# Patient Record
Sex: Female | Born: 1980 | Race: White | Marital: Married
Health system: Northeastern US, Academic
[De-identification: ages and names within clinical notes are randomized; demographics above are authoritative.]

## PROBLEM LIST (undated history)

## (undated) ENCOUNTER — Encounter

## (undated) HISTORY — PX: BREAST LUMPECTOMY: SHX2

---
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
## 2006-09-06 ENCOUNTER — Ambulatory Visit: Payer: Self-pay | Admitting: Internal Medicine

## 2006-10-21 ENCOUNTER — Ambulatory Visit: Payer: Self-pay | Admitting: Family Medicine

## 2006-11-09 ENCOUNTER — Ambulatory Visit: Payer: Self-pay | Admitting: Psychology

## 2006-11-09 DIAGNOSIS — F4323 Adjustment disorder with mixed anxiety and depressed mood: Secondary | ICD-10-CM | POA: Insufficient documentation

## 2006-11-23 ENCOUNTER — Ambulatory Visit: Payer: Self-pay | Admitting: Family Medicine

## 2006-12-07 ENCOUNTER — Ambulatory Visit: Payer: Self-pay | Admitting: Family Medicine

## 2006-12-21 ENCOUNTER — Ambulatory Visit: Payer: Self-pay | Admitting: Family Medicine

## 2007-01-12 ENCOUNTER — Ambulatory Visit: Payer: Self-pay | Admitting: Family Medicine

## 2007-04-26 ENCOUNTER — Encounter (INDEPENDENT_AMBULATORY_CARE_PROVIDER_SITE_OTHER): Payer: Self-pay | Admitting: *Deleted

## 2007-04-26 ENCOUNTER — Ambulatory Visit: Payer: Self-pay | Admitting: Internal Medicine

## 2007-04-26 DIAGNOSIS — H209 Unspecified iridocyclitis: Secondary | ICD-10-CM | POA: Insufficient documentation

## 2007-05-11 ENCOUNTER — Ambulatory Visit: Payer: Self-pay | Admitting: Internal Medicine

## 2007-05-12 ENCOUNTER — Encounter (INDEPENDENT_AMBULATORY_CARE_PROVIDER_SITE_OTHER): Payer: Self-pay | Admitting: *Deleted

## 2015-03-21 ENCOUNTER — Encounter: Payer: Self-pay | Admitting: Emergency Medicine

## 2015-03-21 ENCOUNTER — Ambulatory Visit
Admission: EM | Admit: 2015-03-21 | Discharge: 2015-03-21 | Disposition: A | Discharge status: Home / Self Care | Payer: Medicaid - Out of State | Attending: Internal Medicine | Admitting: Internal Medicine

## 2015-03-21 DIAGNOSIS — L720 Epidermal cyst: Secondary | ICD-10-CM

## 2015-03-21 DIAGNOSIS — L739 Follicular disorder, unspecified: Secondary | ICD-10-CM

## 2015-03-21 MED ORDER — SULFAMETHOXAZOLE-TRIMETHOPRIM 800-160 MG PO TABS: 1.0000 | ORAL_TABLET | Freq: Two times a day (BID) | ORAL | Status: DC

## 2015-03-21 MED ORDER — IBUPROFEN 800 MG PO TABS: 800.0000 mg | ORAL_TABLET | Freq: Three times a day (TID) | ORAL | Status: AC

## 2015-03-21 NOTE — Discharge Instructions (Signed)
Cyst Removal Your caregiver has recommended removed removal of a cyst after acute infection has resolved. A cyst is a sac containing a semi-solid material. Cysts may occur any place on your body. They may remain small for years or gradually get larger. A sebaceous cyst is an enlarged (dilated) sweat gland filled with old sweat (sebum). Unattended, these may become large (the size of a softball) over several years time. These are often removed for improved appearance (cosmetic) reasons or before they become infected to form an abscess. An abscess is an infected cyst. HOME CARE INSTRUCTIONS   Apply warm compresses.  If possible, keep the area where the cyst is located raised to relieve soreness, swelling, and promote healing.  Take medicines as instructed by your caregiver. SEEK IMMEDIATE MEDICAL CARE IF:   An oral temperature above 102 F (38.9 C) develops, not controlled by medication.  You have increasing pain in the area where your cyst was located.  You have redness, swelling, pus, a bad smell, soreness (inflammation), or red streaks coming away from the cyst. These are signs of infection. MAKE SURE YOU:   Understand these instructions.  Will watch your condition.  Will get help right away if you are not doing well or get worse. Document Released: 08/14/2000 Document Revised: 11/09/2011 Document Reviewed: 12/08/2007 [facility] Patient Information 2015 [place], [place]. This information is not intended to replace advice given to you by your health care provider. Make sure you discuss any questions you have with your health care provider.  Excision of Skin Lesions Excision of a skin lesion refers to the removal of a section of skin by making small cuts (incisions) in the skin. This is typically done to remove a cancerous growth (basal cell carcinoma, squamous cell carcinoma, or melanoma) or a noncancerous growth (cyst). It may be done to treat or prevent cancer or infection. It may also be  done to improve cosmetic appearance (removal of mole, skin tag). LET YOUR CAREGIVER KNOW ABOUT:   Allergies to food or medicine.  Medicines taken, including vitamins, herbs, eyedrops, over-the-counter medicines, and creams.  Use of steroids (by mouth or creams).  Previous problems with anesthetics or numbing medicines.  History of bleeding problems or blood clots.  History of any prostheses.  Previous surgery.  Other health problems, including diabetes and kidney problems.  Possibility of pregnancy, if this applies. RISKS AND COMPLICATIONS  Many complications can be managed. With appropriate treatment and rehabilitation, the following complications are very uncommon:  Bleeding.  Infection.  Scarring.  Recurrence of cyst or cancer.  Changes in skin sensation or appearance (discoloration, swelling).  Reaction to anesthesia.  Allergic reaction to surgical materials or ointments.  Damage to nerves, blood vessels, muscles, or other structures.  Continued pain. BEFORE THE PROCEDURE  It is important to follow your caregiver's instructions prior to your procedure to avoid complications. Steps before your procedure may include:  Physical exam, blood tests, other procedures, such as removing a small sample for examination under a microscope (biopsy).  Your caregiver may review the procedure, the anesthesia being used, and what to expect after the procedure with you. You may be asked to:  Stop taking certain medicines, such as blood thinners (including aspirin, clopidogrel, ibuprofen), for several days prior to your procedure.  Take certain medicines.  Stop smoking. It is a good idea to arrange for a ride home after surgery and to have someone to help you with activities during recovery. PROCEDURE  There are several excision techniques. The type  of excision or surgical technique used will depend on your condition, the location of the lesion, and your overall health. After  the lesion is sterilized and a local anesthetic is applied, the following may be performed: Complete surgical excision The area to be removed is marked with a pen. Using a small scalpel and scissors, the surgeon gently cuts around and under the lesion until it is completely removed. The lesion is placed in a special fluid and sent to the lab for examination. If necessary, bleeding will be controlled with a device that delivers heat. The edges of the wound are stitched together and a dressing is applied. This procedure may be performed to treat a cancerous growth or noncancerous cyst or lesion. Surgeons commonly perform an elliptical excision, to minimize scarring. Excision of a cyst The surgeon makes an incision on the cyst. The entire cyst is removed through the incision. The wound may be closed with a suture (stitch). Shave excision During shave excision, the surgeon uses a small blade or loop instrument to shave off the lesion. This may be done to remove a mole or skin tag. The wound is usually left to heal on its own without stitches. Punch excision During punch excision, the surgeon uses a small, round tool (like a cookie cutter) to cut a circle shape out of the skin. The outer edges of the skin are stitched together. This may be done to remove a mole or scar or to perform a biopsy of the lesion. Mohs micrographic surgery During Mohs micrographic surgery, layers of the lesion are removed with a scalpel or loop instrument and immediately examined under a microscope until all of the abnormal or cancerous tissue is removed. This procedure is minimally invasive and ensures the best cosmetic outcome, with removal of as little normal tissue as possible. Mohs is usually done to treat skin cancer, such as basal cell carcinoma or squamous cell carcinoma, particularly on the face and ears. Antibiotic ointment is applied to the surgical area after each of the procedures listed above, as necessary. AFTER THE  PROCEDURE  How well you heal depends on many factors. Most patients heal quite well with proper techniques and self-care. Scarring will lessen over time. HOME CARE INSTRUCTIONS   Take medicines for pain as directed.  Keep the incision area clean, dry, and protected for at least 48 hours. Change dressings as directed.  For bleeding, apply gentle but firm pressure to the wound using a folded towel for 20 minutes. Call your caregiver if bleeding does not stop.  Avoid high-impact exercise and activities until the stitches are removed or the area heals.  Follow your caregiver's instructions to minimize scarring. Avoid sun exposure until the area has healed. Scarring should lessen over time.  Follow up with your caregiver as directed. Removal of stitches within 4 to 14 days may be necessary. Finding out the results of your test Not all test results are available during your visit. If your test results are not back during the visit, make an appointment with your caregiver to find out the results. Do not assume everything is normal if you have not heard from your caregiver or the medical facility. It is important for you to follow up on all of your test results. SEEK MEDICAL CARE IF:   You or your child has an oral temperature above 102 F (38.9 C).  You develop signs of infection (chills, feeling unwell).  You notice bleeding, pain, discharge, redness, or swelling at the incision site.  You notice skin irregularities or changes in sensation. MAKE SURE YOU:   Understand these instructions.  Will watch your condition.  Will get help right away if you are not doing well or get worse. FOR MORE INFORMATION  American Academy [employer]: www.https://powers.com/ American Academy of Dermatology: InfoExam.si Document Released: 11/11/2009 Document Revised: 11/09/2011 Document Reviewed: 11/11/2009 [facility] Patient Information 2015 [place], [place]. This information is not intended to replace advice  given to you by your health care provider. Make sure you discuss any questions you have with your health care provider. Abscess An abscess is an infected area that contains a collection of pus and debris.It can occur in almost any part of the body. An abscess is also known as a furuncle or boil. CAUSES  An abscess occurs when tissue gets infected. This can occur from blockage of oil or sweat glands, infection of hair follicles, or a minor injury to the skin. As the body tries to fight the infection, pus collects in the area and creates pressure under the skin. This pressure causes pain. People with weakened immune systems have difficulty fighting infections and get certain abscesses more often.  SYMPTOMS Usually an abscess develops on the skin and becomes a painful mass that is red, warm, and tender. If the abscess forms under the skin, you may feel a moveable soft area under the skin. Some abscesses break open (rupture) on their own, but most will continue to get worse without care. The infection can spread deeper into the body and eventually into the bloodstream, causing you to feel ill.  DIAGNOSIS  Your caregiver will take your medical history and perform a physical exam. A sample of fluid may also be taken from the abscess to determine what is causing your infection. TREATMENT  Your caregiver may prescribe antibiotic medicines to fight the infection. However, taking antibiotics alone usually does not cure an abscess. Your caregiver may need to make a small cut (incision) in the abscess to drain the pus. In some cases, gauze is packed into the abscess to reduce pain and to continue draining the area. HOME CARE INSTRUCTIONS   Only take over-the-counter or prescription medicines for pain, discomfort, or fever as directed by your caregiver.  If you were prescribed antibiotics, take them as directed. Finish them even if you start to feel better.  If gauze is used, follow your caregiver's directions  for changing the gauze.  To avoid spreading the infection:  Keep your draining abscess covered with a bandage.  Wash your hands well.  Do not share personal care items, towels, or whirlpools with others.  Avoid skin contact with others.  Keep your skin and clothes clean around the abscess.  Keep all follow-up appointments as directed by your caregiver. SEEK MEDICAL CARE IF:   You have increased pain, swelling, redness, fluid drainage, or bleeding.  You have muscle aches, chills, or a general ill feeling.  You have a fever. MAKE SURE YOU:   Understand these instructions.  Will watch your condition.  Will get help right away if you are not doing well or get worse. Document Released: 05/27/2005 Document Revised: 02/16/2012 Document Reviewed: 10/30/2011 [facility] Patient Information 2015 [place], [place]. This information is not intended to replace advice given to you by your health care provider. Make sure you discuss any questions you have with your health care provider. Cellulitis Cellulitis is an infection of the skin and the tissue beneath it. The infected area is usually red and tender. Cellulitis occurs most  often in the arms and lower legs.  CAUSES  Cellulitis is caused by bacteria that enter the skin through cracks or cuts in the skin. The most common types of bacteria that cause cellulitis are staphylococci and streptococci. SIGNS AND SYMPTOMS   Redness and warmth.  Swelling.  Tenderness or pain.  Fever. DIAGNOSIS  Your health care provider can usually determine what is wrong based on a physical exam. Blood tests may also be done. TREATMENT  Treatment usually involves taking an antibiotic medicine. HOME CARE INSTRUCTIONS   Take your antibiotic medicine as directed by your health care provider. Finish the antibiotic even if you start to feel better.  Keep the infected arm or leg elevated to reduce swelling.  Apply a warm cloth to the affected area up to 4  times per day to relieve pain.  Take medicines only as directed by your health care provider.  Keep all follow-up visits as directed by your health care provider. SEEK MEDICAL CARE IF:   You notice red streaks coming from the infected area.  Your red area gets larger or turns dark in color.  Your bone or joint underneath the infected area becomes painful after the skin has healed.  Your infection returns in the same area or another area.  You notice a swollen bump in the infected area.  You develop new symptoms.  You have a fever. SEEK IMMEDIATE MEDICAL CARE IF:   You feel very sleepy.  You develop vomiting or diarrhea.  You have a general ill feeling (malaise) with muscle aches and pains. MAKE SURE YOU:   Understand these instructions.  Will watch your condition.  Will get help right away if you are not doing well or get worse. Document Released: 05/27/2005 Document Revised: 01/01/2014 Document Reviewed: 11/02/2011 [facility] Patient Information 2015 [place], [place]. This information is not intended to replace advice given to you by your health care provider. Make sure you discuss any questions you have with your health care provider. Folliculitis  Folliculitis is redness, soreness, and swelling (inflammation) of the hair follicles. This condition can occur anywhere on the body. People with weakened immune systems, diabetes, or obesity have a greater risk of getting folliculitis. CAUSES  Bacterial infection. This is the most common cause.  Fungal infection.  Viral infection.  Contact with certain chemicals, especially oils and tars. Long-term folliculitis can result from bacteria that live in the nostrils. The bacteria may trigger multiple outbreaks of folliculitis over time. SYMPTOMS Folliculitis most commonly occurs on the scalp, thighs, legs, back, buttocks, and areas where hair is shaved frequently. An early sign of folliculitis is a small, white or yellow,  pus-filled, itchy lesion (pustule). These lesions appear on a red, inflamed follicle. They are usually less than 0.2 inches (5 mm) wide. When there is an infection of the follicle that goes deeper, it becomes a boil or furuncle. A group of closely packed boils creates a larger lesion (carbuncle). Carbuncles tend to occur in hairy, sweaty areas of the body. DIAGNOSIS  Your caregiver can usually tell what is wrong by doing a physical exam. A sample may be taken from one of the lesions and tested in a lab. This can help determine what is causing your folliculitis. TREATMENT  Treatment may include:  Applying warm compresses to the affected areas.  Taking antibiotic medicines orally or applying them to the skin.  Draining the lesions if they contain a large amount of pus or fluid.  Laser hair removal for cases of long-lasting  folliculitis. This helps to prevent regrowth of the hair. HOME CARE INSTRUCTIONS  Apply warm compresses to the affected areas as directed by your caregiver.  If antibiotics are prescribed, take them as directed. Finish them even if you start to feel better.  You may take over-the-counter medicines to relieve itching.  Do not shave irritated skin.  Follow up with your caregiver as directed. SEEK IMMEDIATE MEDICAL CARE IF:   You have increasing redness, swelling, or pain in the affected area.  You have a fever. MAKE SURE YOU:  Understand these instructions.  Will watch your condition.  Will get help right away if you are not doing well or get worse. Document Released: 10/26/2001 Document Revised: 02/16/2012 Document Reviewed: 11/17/2011 [facility] Patient Information 2015 [place], [place]. This information is not intended to replace advice given to you by your health care provider. Make sure you discuss any questions you have with your health care provider.

## 2015-03-21 NOTE — ED Provider Notes (Signed)
CSN: [id number]     Arrival date & time 03/21/15  1511 History   First MD Initiated Contact with Patient 03/21/15 1546     Chief Complaint  Patient presents with  . Abscess   (Consider location/radiation/quality/duration/timing/severity/associated sxs/prior Treatment) HPI Comments: New patient Married caucasian female visiting area family from [place].  Pain left upper back, swelling, redness over recurrent cyst that had popped 5 years ago and resolved but then started to enlarge again 2 years ago and is now larger than when it popped 5 years ago.  Denied insect bite, trauma to area but noted redness, swelling, tenderness and pain worsening over the past 2 days.  Wondering if it is an abscess that needs to be drained or the cyst is infected.  Also has lesions on bikini line she would like evaluated present for a couple of months, calls it "blood blister" using razor and shaving genital hair weekly.  Patient is a 34 y.o. female presenting with abscess. The history is provided by the patient.  Abscess Location:  Torso Torso abscess location:  Upper back Size:  2cm Abscess quality: induration, painful, redness and warmth   Abscess quality: not draining, no fluctuance, no itching and not weeping   Red streaking: no   Duration:  2 days Progression:  Worsening Pain details:    Quality:  Pressure and throbbing   Severity:  Moderate   Duration:  2 days   Timing:  Constant   Progression:  Worsening Chronicity:  Recurrent Context: not diabetes, not immunosuppression, not injected drug use, not insect bite/sting and not skin injury   Relieved by:  Nothing Associated symptoms: no anorexia, no fatigue, no fever, no headaches, no nausea and no vomiting   Risk factors: prior abscess   Risk factors: no family hx of MRSA and no hx of MRSA     History reviewed. No pertinent past medical history. Past Surgical History  Procedure Laterality Date  . Breast lumpectomy     History reviewed. No pertinent  family history. History  Substance Use Topics  . Smoking status: Never Smoker   . Smokeless tobacco: Never Used  . Alcohol Use: No   OB History    No data available     Review of Systems  Constitutional: Negative for fever, chills, diaphoresis, activity change, appetite change and fatigue.  HENT: Negative for dental problem, drooling, ear discharge, ear pain, facial swelling, mouth sores, tinnitus, trouble swallowing and voice change.   Eyes: Negative for photophobia, pain, discharge, redness, itching and visual disturbance.  Respiratory: Negative for cough, shortness of breath and wheezing.   Cardiovascular: Negative for chest pain and leg swelling.  Gastrointestinal: Positive for diarrhea. Negative for nausea, vomiting, abdominal pain, constipation, blood in stool, abdominal distention and anorexia.  Endocrine: Negative for cold intolerance and heat intolerance.  Genitourinary: Negative for dysuria, hematuria, genital sores and menstrual problem.  Musculoskeletal: Negative for myalgias, back pain, joint swelling, arthralgias, gait problem, neck pain and neck stiffness.  Skin: Positive for color change and rash. Negative for pallor and wound.  Allergic/Immunologic: Negative for environmental allergies and food allergies.  Neurological: Negative for dizziness, tremors, seizures, syncope, facial asymmetry, speech difficulty, weakness, light-headedness, numbness and headaches.  Hematological: Negative for adenopathy. Does not bruise/bleed easily.  Psychiatric/Behavioral: Negative for behavioral problems, confusion, sleep disturbance and agitation.    Allergies  Erythromycin and Erythromycin base  Home Medications   Prior to Admission medications   Medication Sig Start Date End Date Taking? Authorizing Provider  ibuprofen (  ADVIL,MOTRIN) 800 MG tablet Take 1 tablet (800 mg total) by mouth 3 (three) times daily. 03/21/15   Barbaraann Barthel, NP  sulfamethoxazole-trimethoprim (BACTRIM  DS,SEPTRA DS) 800-160 MG per tablet Take 1 tablet by mouth 2 (two) times daily. 03/21/15   Barbaraann Barthel, NP   BP 96/63 mmHg  Pulse 85  Temp(Src) 98.4 F (36.9 C) (Tympanic)  Resp 16  Ht 5\' 5"  (1.651 m)  Wt 210 lb (95.255 kg)  BMI 34.95 kg/m2  SpO2 100%  LMP 03/08/2015 (Exact Date) Physical Exam  Constitutional: She is oriented to person, place, and time. Vital signs are normal. She appears well-developed and well-nourished. No distress.  HENT:  Head: Normocephalic and atraumatic.  Right Ear: External ear normal.  Left Ear: External ear normal.  Nose: Nose normal.  Mouth/Throat: Oropharynx is clear and moist. No oropharyngeal exudate.  Eyes: Conjunctivae, EOM and lids are normal. Pupils are equal, round, and reactive to light. Right eye exhibits no discharge. Left eye exhibits no discharge. No scleral icterus.  Neck: Trachea normal and normal range of motion. Neck supple. No tracheal deviation present. No thyromegaly present.  Cardiovascular: Normal rate, regular rhythm and intact distal pulses.   Pulmonary/Chest: Effort normal and breath sounds normal. No stridor. No respiratory distress. She has no wheezes.  Abdominal: Soft. She exhibits no distension.  Musculoskeletal: Normal range of motion. She exhibits tenderness. She exhibits no edema.       Right shoulder: Normal.       Left shoulder: Normal.       Cervical back: Normal.  Lymphadenopathy:    She has no cervical adenopathy.    She has no axillary adenopathy.       Left axillary: No pectoral and no lateral adenopathy present.      Right: No inguinal adenopathy present.  Neurological: She is alert and oriented to person, place, and time. She exhibits normal muscle tone. Coordination normal.  Skin: Skin is warm, dry and intact. Bruising and rash noted. No abrasion, no burn, no ecchymosis, no laceration, no lesion, no petechiae and no purpura noted. Rash is macular and maculopapular. Rash is not papular, not nodular, not  pustular, not vesicular and not urticarial. She is not diaphoretic. There is erythema. No cyanosis. No pallor. Nails show no clubbing.     Superior to left scapular ridge 2cm diameter erythematous induration TTP with central palor non-moveable, dry; right bikini line/inguinal area with 3 4-19mm ecchymosis around hair follicle not tender to palpation dry  Psychiatric: She has a normal mood and affect. Her speech is normal and behavior is normal. Judgment and thought content normal. Cognition and memory are normal.  Nursing note and vitals reviewed.   ED Course  Procedures (including critical care time) Labs Review Labs Reviewed - No data to display  Imaging Review No results found.   MDM   1. Cyst of skin and subcutaneous tissue   2. Acute folliculitis    History of infected cyst 5 years ago that spontaneously ruptured and resolved at same location left upper back.  Patient to apply warm compresses and return for re-evaluation in 48 hours--discussed if pain, redness resolved/resolving and if no fluctuance no need to come in for follow up as typically nothing to I&D.  Bactrim DS po BID x 7 days.  Infected skin/subcutaneous cyst left upper back versus cellulitis that has erupted over cyst.  Discussed not recommended to I&D cyst without removal of cyst sac as reoccurrence likely. Likelihood of cyst rupture greater  if inflamed. Typically general surgery performs these procedures.  Exitcare handout on skin infection, abscess, cyst, cyst removal given to patient.  RTC if worsening erythema, pain, purulent discharge, fever.  Wash towels, washcloths, sheets in hot water with bleach every couple of days until infection resolved.  Patient verbalized understanding, agreed with plan of care and had no further questions at this time.    Discussed folliculitis, pseudofolliculitis barbae with patient e.g. Use shaving cream, shave with direction of hair growth not against, lather up well and shower/bathe first  to allow hair to soften.  Refrain from shaving at this time until current inflammation resolved.  May apply OTC hydrocortisone cream 1% sparingly to affected hair follicles if irritation after shaving BID topically.  Discussed the difference between razor burn, cellulitis and folliculitis with patient.  Discussed with patient if infection around hair follicle bactrim DS po BID x 7 days will treat this infection also.  Patient verbalized understanding of information, instructions, agreed with plan of care and had no further questions at this time.    Barbaraann Barthel, NP 03/21/15 1642

## 2015-03-21 NOTE — ED Notes (Signed)
Patient c/o abscess to her upper back for the past 2 months but pain has gotten worse over the past couple of days. Patient denies fevers.

## 2015-03-24 ENCOUNTER — Emergency Department
Admission: EM | Admit: 2015-03-24 | Discharge: 2015-03-24 | Disposition: A | Discharge status: Home / Self Care | Payer: Medicaid - Out of State | Attending: Emergency Medicine | Admitting: Emergency Medicine

## 2015-03-24 DIAGNOSIS — Z792 Long term (current) use of antibiotics: Secondary | ICD-10-CM | POA: Diagnosis not present

## 2015-03-24 DIAGNOSIS — L02414 Cutaneous abscess of left upper limb: Secondary | ICD-10-CM | POA: Insufficient documentation

## 2015-03-24 DIAGNOSIS — M25512 Pain in left shoulder: Secondary | ICD-10-CM | POA: Diagnosis present

## 2015-03-24 MED ORDER — OXYCODONE-ACETAMINOPHEN 5-325 MG PO TABS
2.0000 | ORAL_TABLET | Freq: Once | ORAL | Status: AC
  Administered 2015-03-24: 2 via ORAL
  Filled 2015-03-24: qty 2

## 2015-03-24 MED ORDER — OXYCODONE-ACETAMINOPHEN 5-325 MG PO TABS: ORAL_TABLET | ORAL | Status: AC

## 2015-03-24 NOTE — ED Provider Notes (Signed)
[facility] Emergency Department Provider Note  ____________________________________________  Time seen:10:36 AM  I have reviewed the triage vital signs and the nursing notes.   HISTORY  Chief Complaint Cyst   HPI Isabel Brown is a 34 y.o. female is here with complaint of left shoulder abscess. She was seen at [facility] urgent care on 7/21 and started on Bactrim. She states that last night she was unable to get any sleep due to the constant pain in her left shoulder. She states the ibuprofen that [place] urgent care gave her is not controlling her pain. She denies any fever or chills. Currently her pain is 10 out of 10. She was not instructed to use any warm compresses on this area.She states she is from out of town and will be in this area until Tuesday.   No past medical history on file.  Patient Active Problem List   Diagnosis Date Noted  . IRITIS 04/26/2007  . 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 IRREGULAR MENSES 04/25/1994    Past Surgical History  Procedure Laterality Date  . Breast lumpectomy      Current Outpatient Rx  Name  Route  Sig  Dispense  Refill  . ibuprofen (ADVIL,MOTRIN) 800 MG tablet   Oral   Take 1 tablet (800 mg total) by mouth 3 (three) times daily.   21 tablet   0   . oxyCODONE-acetaminophen (PERCOCET) 5-325 MG per tablet      Take 1 -2 every 4-6 hours as needed for pain   30 tablet   0   . sulfamethoxazole-trimethoprim (BACTRIM DS,SEPTRA DS) 800-160 MG per tablet   Oral   Take 1 tablet by mouth 2 (two) times daily.   14 tablet   0     Allergies Erythromycin and Erythromycin base  No family history on file.  Social History History  Substance Use Topics  . Smoking status: Never Smoker   . Smokeless tobacco: Never Used  . Alcohol Use: No    Review of Systems Constitutional: No fever/chills Cardiovascular: Denies chest pain. Respiratory: Denies shortness of breath. Gastrointestinal: No  abdominal pain.  No nausea, no vomiting.  Genitourinary: Negative for dysuria. Musculoskeletal: Negative for back pain. Skin: Negative for rash. Positive for abscess. Neurological: Negative for headaches, focal weakness or numbness.  10-point ROS otherwise negative.  ____________________________________________   PHYSICAL EXAM:  VITAL SIGNS: ED Triage Vitals  Enc Vitals Group     BP 03/24/15 0923 113/73 mmHg     Pulse Rate 03/24/15 0923 80     Resp 03/24/15 0923 17     Temp 03/24/15 0923 98.1 F (36.7 C)     Temp Source 03/24/15 0923 Oral     SpO2 03/24/15 0923 98 %     Weight 03/24/15 0923 210 lb (95.255 kg)     Height 03/24/15 0923 5\' 5"  (1.651 m)     Head Cir --      Peak Flow --      Pain Score 03/24/15 0924 10     Pain Loc --      Pain Edu? --      Excl. in GC? --     Constitutional: Alert and oriented. Well appearing and in no acute distress. Eyes: Conjunctivae are normal. PERRL. EOMI. Head: Atraumatic. Nose: No congestion/rhinnorhea. Neck: No stridor.   Cardiovascular: Normal rate, regular rhythm. Grossly normal heart sounds.  Good peripheral circulation. Respiratory: Normal respiratory effort.  No retractions. Lungs CTAB. Gastrointestinal: Soft and  nontender. No distention.  Musculoskeletal: No lower extremity tenderness nor edema.  No joint effusions. Range of motion upper extremities within normal limits. Left shoulder is slightly restricted secondary to infected cyst posteriorly Neurologic:  Normal speech and language. No gross focal neurologic deficits are appreciated. No gait instability. Skin:  Skin is warm, dry and intact. No rash noted. Left shoulder posteriorly there is a very hard erythematous area without fluctuance. Area is very tender to touch. Area measures approximately 3 cm. Psychiatric: Mood and affect are normal. Speech and behavior are normal.  ____________________________________________   LABS (all labs ordered are listed, but only  abnormal results are displayed)  Labs Reviewed - No data to display  PROCEDURES  Procedure(s) performed: None  Critical Care performed: No  ____________________________________________   INITIAL IMPRESSION / ASSESSMENT AND PLAN / ED COURSE  Pertinent labs & imaging results that were available during my care of the patient were reviewed by me and considered in my medical decision making (see chart for details).  Patient has been using warm compresses frequently to the area and continue on Bactrim. She is given a prescription for Percocet as needed for pain. She was instructed to return in 24 hours if area becomes fluctuant for I&D ____________________________________________   FINAL CLINICAL IMPRESSION(S) / ED DIAGNOSES  Final diagnoses:  Abscess of left shoulder      Tommi Rumps, PA-C 03/24/15 1412  Governor Rooks, MD 03/24/15 1534

## 2015-03-24 NOTE — ED Notes (Signed)
Pt states she has a cyst to left shoulder. Pt was seen at urgent care last wed. Pt was placed on abx and has been taking them. Pt states she continues to get worse and is extremely painful.

## 2015-03-24 NOTE — Discharge Instructions (Signed)

## 2015-03-24 NOTE — ED Notes (Signed)
Pt encourage to return to ED should abscess worsen.

## 2015-03-24 NOTE — ED Notes (Signed)
Pt here for abscess on her back.  Pt was seen at [facility] on Thursday and given Rx for Bactrim and ibuprofen.  Pt comes to ED today for worsening pain and increased swelling.

## 2015-03-29 ENCOUNTER — Encounter: Payer: Self-pay | Admitting: Emergency Medicine

## 2015-03-29 ENCOUNTER — Emergency Department
Admission: EM | Admit: 2015-03-29 | Discharge: 2015-03-29 | Disposition: A | Discharge status: Home / Self Care | Payer: Medicaid - Out of State | Attending: Emergency Medicine | Admitting: Emergency Medicine

## 2015-03-29 DIAGNOSIS — Z7689 Persons encountering health services in other specified circumstances: Secondary | ICD-10-CM

## 2015-03-29 DIAGNOSIS — L089 Local infection of the skin and subcutaneous tissue, unspecified: Secondary | ICD-10-CM | POA: Insufficient documentation

## 2015-03-29 DIAGNOSIS — L723 Sebaceous cyst: Secondary | ICD-10-CM | POA: Diagnosis not present

## 2015-03-29 DIAGNOSIS — Z4800 Encounter for change or removal of nonsurgical wound dressing: Secondary | ICD-10-CM | POA: Diagnosis present

## 2015-03-29 DIAGNOSIS — Z0189 Encounter for other specified special examinations: Secondary | ICD-10-CM

## 2015-03-29 DIAGNOSIS — Z792 Long term (current) use of antibiotics: Secondary | ICD-10-CM | POA: Diagnosis not present

## 2015-03-29 DIAGNOSIS — Z791 Long term (current) use of non-steroidal anti-inflammatories (NSAID): Secondary | ICD-10-CM | POA: Diagnosis not present

## 2015-03-29 MED ORDER — SULFAMETHOXAZOLE-TRIMETHOPRIM 800-160 MG PO TABS: 2.0000 | ORAL_TABLET | Freq: Two times a day (BID) | ORAL | Status: AC

## 2015-03-29 MED ORDER — LIDOCAINE-EPINEPHRINE (PF) 1 %-1:200000 IJ SOLN
30.0000 mL | Freq: Once | INTRAMUSCULAR | Status: AC
  Administered 2015-03-29: 30 mL via INTRADERMAL
  Filled 2015-03-29: qty 30

## 2015-03-29 NOTE — ED Provider Notes (Signed)
[facility] Emergency Department Provider Note ?____________________________________________ ? Time seen: 0802 ? I have reviewed the triage vital signs and the nursing notes. ________ HISTORY ? Chief Complaint Wound Check  HPI  Isabel Brown is a 34 y.o. female reports to the ED for evaluation management of an abscess to the left upper back. [facility] who initially put her on ibuprofen and Bactrim on 7/21. She was then seen here on 7/24, and given Percocet for pain. She was also advised to apply warm compresses to the then cellulitic area to help develop any underlying abscess. She returns today noting that the pain is still 10/10, and reports spontaneous drainage in the last 24 hours to the area. She is requesting I&D at this time. She claims to have been on Bactrim for a total of 5 days at 1 tablet twice a day.  History reviewed. No pertinent past medical history.  Patient Active Problem List   Diagnosis Date Noted  . IRITIS 04/26/2007  . 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 IRREGULAR MENSES 04/25/1994   ? Past Surgical History  Procedure Laterality Date  . Breast lumpectomy     ? Current Outpatient Rx  Name  Route  Sig  Dispense  Refill  . ibuprofen (ADVIL,MOTRIN) 800 MG tablet   Oral   Take 1 tablet (800 mg total) by mouth 3 (three) times daily.   21 tablet   0   . oxyCODONE-acetaminophen (PERCOCET) 5-325 MG per tablet      Take 1 -2 every 4-6 hours as needed for pain   30 tablet   0   . sulfamethoxazole-trimethoprim (BACTRIM DS,SEPTRA DS) 800-160 MG per tablet   Oral   Take 2 tablets by mouth 2 (two) times daily.   20 tablet   0    Allergies Erythromycin and Erythromycin base ? No family history on file. ? Social History History  Substance Use Topics  . Smoking status: Never Smoker   . Smokeless tobacco: Never Used  . Alcohol Use: No   Review of Systems Constitutional: Negative  for fever. HEENT:  Normocephalic/atraumatic. Negative for visual/hearingchanges, sore throat, or nasal congestion. Cardiovascular: Negative for chest pain. Respiratory: Negative for shortness of breath. Musculoskeletal: Negative for back pain. Skin: tender, indurated skin over the left blade Neurological: Negative for headaches, focal weakness or numbness. Hematological/Lymphatic:Negative for enlarged lymph nodes  10-point ROS otherwise negative. ____________________________________________  PHYSICAL EXAM:  VITAL SIGNS: ED Triage Vitals  Enc Vitals Group     BP --      Pulse --      Resp --      Temp --      Temp src --      SpO2 --      Weight --      Height --      Head Cir --      Peak Flow --      Pain Score --      Pain Loc --      Pain Edu? --      Excl. in GC? --    Constitutional: Alert and oriented. Well appearing and in no distress. HEENT: Normocephalic and atraumatic.Conjunctivae are normal. PERRL. Normal extraocular movements. Mucous membranes are moist. Hematological/Lymphatic/Immunilogical: No cervical lymphadenopathy. Cardiovascular: Normal rate, regular rhythm.No murmurs, rubs, or gallops.  Respiratory: Normal respiratory effort. Breath sounds are clear and equal bilaterally. No wheezes/rales/rhonchi. Gastrointestinal: Soft and nontender. No distention.  Musculoskeletal:  Nontender with normal range of motion in all extremities. No joint effusions.  No lower extremity tenderness nor edema. Neurologic:  Normal speech and language. No gross focal neurologic deficits are appreciated.  Skin:  Large, erythematous, pointing abscess to the left upper back.  Psychiatric: Mood and affect are normal. Speech and behavior are normal. Patient exhibits appropriate insight and judgment. _______________________ INCISION AND DRAINAGE  Performed by: Lissa Hoard Consent: Verbal consent obtained. Risks and benefits: risks, benefits and alternatives were  discussed Type: abscess  Body area: left scapula  Anesthesia: local infiltration  Incision was made with a scalpel.  Local anesthetic: lidocaine 1% w/o epinephrine  Anesthetic total: 8 ml  Complexity: complex Blunt dissection to break up loculations  Drainage: pus and sebaceous cyst contents  Drainage amount: large  Packing material: 1/4 in iodoform gauze  Patient tolerance: Patient tolerated the procedure well with no immediate complications. ______________________________________________________ INITIAL IMPRESSION / ASSESSMENT AND PLAN / ED COURSE ? Large infected sebaceous cyst with surrounding cellulitis. I&D with packing after significant expression of infectious and cheesy sebaceous materials. Patient to return in 2-3 days for wound check and repacking. Prescription for Bactrim DS 2 tabs BID x 5 days more.  ____________________________________________ FINAL CLINICAL IMPRESSION(S) / ED DIAGNOSES?  Final diagnoses:  Infected sebaceous cyst  Encounter for incision and drainage procedure      Lissa Hoard, PA-C 03/29/15 1550  Darien Ramus, MD 03/30/15 [phone]

## 2015-03-29 NOTE — Discharge Instructions (Signed)
Epidermal Cyst An epidermal cyst is sometimes called a sebaceous cyst, epidermal inclusion cyst, or infundibular cyst. These cysts usually contain a substance that looks "pasty" or "cheesy" and may have a bad smell. This substance is a protein called keratin. Epidermal cysts are usually found on the face, neck, or trunk. They may also occur in the vaginal area or other parts of the genitalia of both men and women. Epidermal cysts are usually small, painless, slow-growing bumps or lumps that move freely under the skin. It is important not to try to pop them. This may cause an infection and lead to tenderness and swelling. CAUSES  Epidermal cysts may be caused by a deep penetrating injury to the skin or a plugged hair follicle, often associated with acne. SYMPTOMS  Epidermal cysts can become inflamed and cause:  Redness.  Tenderness.  Increased temperature of the skin over the bumps or lumps.  Grayish-white, bad smelling material that drains from the bump or lump. DIAGNOSIS  Epidermal cysts are easily diagnosed by your caregiver during an exam. Rarely, a tissue sample (biopsy) may be taken to rule out other conditions that may resemble epidermal cysts. TREATMENT   Epidermal cysts often get better and disappear on their own. They are rarely ever cancerous.  If a cyst becomes infected, it may become inflamed and tender. This may require opening and draining the cyst. Treatment with antibiotics may be necessary. When the infection is gone, the cyst may be removed with minor surgery.  Small, inflamed cysts can often be treated with antibiotics or by injecting steroid medicines.  Sometimes, epidermal cysts become large and bothersome. If this happens, surgical removal in your caregiver's office may be necessary. HOME CARE INSTRUCTIONS  Only take over-the-counter or prescription medicines as directed by your caregiver.  Take your antibiotics as directed. Finish them even if you start to feel  better. SEEK MEDICAL CARE IF:   Your cyst becomes tender, red, or swollen.  Your condition is not improving or is getting worse.  You have any other questions or concerns. MAKE SURE YOU:  Understand these instructions.  Will watch your condition.  Will get help right away if you are not doing well or get worse. Document Released: 07/18/2004 Document Revised: 11/09/2011 Document Reviewed: 02/23/2011 [facility] Patient Information 2015 [place], [place]. This information is not intended to replace advice given to you by your health care provider. Make sure you discuss any questions you have with your health care provider.  Return to the ED in 3 days for wound check, packing removal, and probable re-application. Change dressing daily.  Apply warm compresses over the dressing, to promote healing. Take the Bactrim 2 pills am & pm for infection.

## 2015-03-29 NOTE — ED Notes (Signed)
Here for recheck of wound on left shoulder.  No fever.

## 2015-04-01 ENCOUNTER — Emergency Department
Admission: EM | Admit: 2015-04-01 | Discharge: 2015-04-01 | Disposition: A | Discharge status: Home / Self Care | Payer: Medicaid - Out of State | Attending: Student | Admitting: Student

## 2015-04-01 ENCOUNTER — Encounter: Payer: Self-pay | Admitting: Emergency Medicine

## 2015-04-01 DIAGNOSIS — Z4801 Encounter for change or removal of surgical wound dressing: Secondary | ICD-10-CM | POA: Diagnosis not present

## 2015-04-01 DIAGNOSIS — Z792 Long term (current) use of antibiotics: Secondary | ICD-10-CM | POA: Diagnosis not present

## 2015-04-01 DIAGNOSIS — Z5189 Encounter for other specified aftercare: Secondary | ICD-10-CM

## 2015-04-01 DIAGNOSIS — Z791 Long term (current) use of non-steroidal anti-inflammatories (NSAID): Secondary | ICD-10-CM | POA: Diagnosis not present

## 2015-04-01 MED ORDER — LIDOCAINE HCL (PF) 1 % IJ SOLN
5.0000 mL | Freq: Once | INTRAMUSCULAR | Status: AC
  Administered 2015-04-01: 5 mL
  Filled 2015-04-01: qty 5

## 2015-04-01 MED ORDER — OXYCODONE-ACETAMINOPHEN 5-325 MG PO TABS: 1.0000 | ORAL_TABLET | ORAL | Status: AC | PRN

## 2015-04-01 NOTE — ED Provider Notes (Signed)
[facility] Emergency Department Provider Note  ____________________________________________  Time seen: Approximately 7:30 AM  I have reviewed the triage vital signs and the nursing notes.   HISTORY  Chief Complaint Wound Check    HPI Isabel Brown is a 34 y.o. female patient here today for wound check status post I&D of sebaceous cyst left scapular area. Area was I&D on 03/21/2015. Patient say continual drainage.Patient rating the pain as a 5/10. Patient has been no fever or chills. Patient continued antibodies as directed.   History reviewed. No pertinent past medical history.  Patient Active Problem List   Diagnosis Date Noted  . IRITIS 04/26/2007  . 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 IRREGULAR MENSES 04/25/1994    Past Surgical History  Procedure Laterality Date  . Breast lumpectomy      Current Outpatient Rx  Name  Route  Sig  Dispense  Refill  . ibuprofen (ADVIL,MOTRIN) 800 MG tablet   Oral   Take 1 tablet (800 mg total) by mouth 3 (three) times daily.   21 tablet   0   . oxyCODONE-acetaminophen (PERCOCET) 5-325 MG per tablet      Take 1 -2 every 4-6 hours as needed for pain   30 tablet   0   . sulfamethoxazole-trimethoprim (BACTRIM DS,SEPTRA DS) 800-160 MG per tablet   Oral   Take 2 tablets by mouth 2 (two) times daily.   20 tablet   0     Allergies Erythromycin and Erythromycin base  No family history on file.  Social History History  Substance Use Topics  . Smoking status: Never Smoker   . Smokeless tobacco: Never Used  . Alcohol Use: No    Review of Systems Constitutional: No fever/chills Eyes: No visual changes. ENT: No sore throat. Cardiovascular: Denies chest pain. Respiratory: Denies shortness of breath. Gastrointestinal: No abdominal pain.  No nausea, no vomiting.  No diarrhea.  No constipation. Genitourinary: Negative for dysuria. Musculoskeletal: Negative for back pain. Skin:  Negative for rash. Mild erythematous surrounding the incision site. Mild drainage. Neurological: Negative for headaches, focal weakness or numbness.  10-point ROS otherwise negative.  ____________________________________________   PHYSICAL EXAM:  VITAL SIGNS: ED Triage Vitals  Enc Vitals Group     BP 04/01/15 0715 100/58 mmHg     Pulse Rate 04/01/15 0715 95     Resp 04/01/15 0715 20     Temp 04/01/15 0715 98.2 F (36.8 C)     Temp Source 04/01/15 0715 Oral     SpO2 04/01/15 0715 98 %     Weight 04/01/15 0715 210 lb (95.255 kg)     Height 04/01/15 0715  (1.651 m)     Head Cir --      Peak Flow --      Pain Score --      Pain Loc --      Pain Edu? --      Excl. in GC? --     Constitutional: Alert and oriented. Well appearing and in no acute distress. Ears anxious Eyes: Conjunctivae are normal. PERRL. EOMI. Head: Atraumatic. Nose: No congestion/rhinnorhea. Mouth/Throat: Mucous membranes are moist.  Oropharynx non-erythematous. Neck: No stridor. No cervical spine tenderness to palpation. Hematological/Lymphatic/Immunilogical: No cervical lymphadenopathy. Cardiovascular: Normal rate, regular rhythm. Grossly normal heart sounds.  Good peripheral circulation. Respiratory: Normal respiratory effort.  No retractions. Lungs CTAB. Gastrointestinal: Soft and nontender. No distention. No abdominal bruits. No CVA tenderness. Musculoskeletal: No lower extremity tenderness nor edema.  No joint effusions. Neurologic:  Normal speech and language. No gross focal neurologic deficits are appreciated. No gait instability. Skin:  Skin is warm, dry and intact. No rash noted. Status post removal of the packing material mild drainage is apparent. Receding erythema but moderate guarding palpation. Psychiatric: Mood and affect are normal. Speech and behavior are normal.  ____________________________________________   LABS (all labs ordered are listed, but only abnormal results are  displayed)  Labs Reviewed - No data to display ____________________________________________  EKG   ____________________________________________  RADIOLOGY   ____________________________________________   PROCEDURES  Procedure(s) performed: See procedure note   Critical Care performed: No  ____________________________________________   INITIAL IMPRESSION / ASSESSMENT AND PLAN / ED COURSE  Pertinent labs & imaging results that were available during my care of the patient were reviewed by me and considered in my medical decision making (see chart for details).   wound checked.packing material was removed and area was irrigated with clear return. Area was then re-bandage the patient and advised on wound care. Advised to continue antibodies and return by ER if condition worsens. ___________________________________________   FINAL CLINICAL IMPRESSION(S) / ED DIAGNOSES  Final diagnoses:  Wound check, abscess      Joni Reining, PA-C 04/01/15 1610  Gayla Doss, MD 04/01/15 1547

## 2015-04-01 NOTE — ED Notes (Signed)
Area is still draining ..wounbd undressed for provider

## 2015-04-01 NOTE — ED Notes (Signed)
Here for wound check

## 2016-08-25 ENCOUNTER — Ambulatory Visit
Admission: EM | Admit: 2016-08-25 | Discharge: 2016-08-25 | Discharge status: Absent without leave | Payer: Medicaid - Out of State

## 2017-11-17 ENCOUNTER — Ambulatory Visit: Admitting: Physician Assistant

## 2018-02-08 ENCOUNTER — Ambulatory Visit: Admitting: Physician Assistant

## 2020-04-22 ENCOUNTER — Emergency Department: Payer: Medicaid - Out of State

## 2020-04-22 ENCOUNTER — Other Ambulatory Visit: Payer: Self-pay

## 2020-04-22 ENCOUNTER — Emergency Department
Admission: EM | Admit: 2020-04-22 | Discharge: 2020-04-22 | Disposition: A | Discharge status: Home / Self Care | Payer: Medicaid - Out of State | Attending: Emergency Medicine | Admitting: Emergency Medicine

## 2020-04-22 DIAGNOSIS — Y9301 Activity, walking, marching and hiking: Secondary | ICD-10-CM | POA: Insufficient documentation

## 2020-04-22 DIAGNOSIS — M25552 Pain in left hip: Secondary | ICD-10-CM | POA: Diagnosis not present

## 2020-04-22 DIAGNOSIS — W108XXA Fall (on) (from) other stairs and steps, initial encounter: Secondary | ICD-10-CM | POA: Diagnosis not present

## 2020-04-22 DIAGNOSIS — Z79899 Other long term (current) drug therapy: Secondary | ICD-10-CM | POA: Insufficient documentation

## 2020-04-22 DIAGNOSIS — M25521 Pain in right elbow: Secondary | ICD-10-CM | POA: Insufficient documentation

## 2020-04-22 DIAGNOSIS — R519 Headache, unspecified: Secondary | ICD-10-CM | POA: Diagnosis not present

## 2020-04-22 DIAGNOSIS — S4991XA Unspecified injury of right shoulder and upper arm, initial encounter: Secondary | ICD-10-CM | POA: Insufficient documentation

## 2020-04-22 DIAGNOSIS — Y999 Unspecified external cause status: Secondary | ICD-10-CM | POA: Diagnosis not present

## 2020-04-22 DIAGNOSIS — T07XXXA Unspecified multiple injuries, initial encounter: Secondary | ICD-10-CM

## 2020-04-22 DIAGNOSIS — Y92009 Unspecified place in unspecified non-institutional (private) residence as the place of occurrence of the external cause: Secondary | ICD-10-CM | POA: Insufficient documentation

## 2020-04-22 DIAGNOSIS — W19XXXA Unspecified fall, initial encounter: Secondary | ICD-10-CM

## 2020-04-22 DIAGNOSIS — S299XXA Unspecified injury of thorax, initial encounter: Secondary | ICD-10-CM | POA: Insufficient documentation

## 2020-04-22 MED ORDER — HYDROCODONE-ACETAMINOPHEN 5-325 MG PO TABS
1.0000 | ORAL_TABLET | Freq: Once | ORAL | Status: AC
  Administered 2020-04-22: 1 via ORAL
  Filled 2020-04-22: qty 1

## 2020-04-22 MED ORDER — OXYCODONE-ACETAMINOPHEN 5-325 MG PO TABS
1.0000 | ORAL_TABLET | ORAL | Status: DC | PRN
  Administered 2020-04-22: 1 via ORAL
  Filled 2020-04-22: qty 1

## 2020-04-22 MED ORDER — BACLOFEN 10 MG PO TABS: 10.0000 mg | ORAL_TABLET | Freq: Three times a day (TID) | ORAL | 1 refills | Status: AC

## 2020-04-22 MED ORDER — HYDROCODONE-ACETAMINOPHEN 5-325 MG PO TABS: 1.0000 | ORAL_TABLET | Freq: Four times a day (QID) | ORAL | 0 refills | Status: AC | PRN

## 2020-04-22 MED ORDER — MELOXICAM 15 MG PO TABS: 15.0000 mg | ORAL_TABLET | Freq: Every day | ORAL | 2 refills | Status: AC

## 2020-04-22 NOTE — ED Triage Notes (Signed)
Pt coming from home via EMS after fall from 13 stairs. Pt denies loss of consciousness. Pt complains of severe right shoulder pain, back pain, and left thigh pain. Pt rates pain 10/10. Pt in apparent distress during triage. C-collar in place.

## 2020-04-22 NOTE — ED Notes (Signed)
Patient coming [facility] From home for fall down 13 stairs. Patient c/o back, right shoulder, left thigh pain. Cervical spine cleared by EMS. Pt rates shoulder pain 10/10. EMS states no deformities noted to shoulder.

## 2020-04-22 NOTE — ED Notes (Signed)
Pt fell down 15 stairs.  C/o right neck and arm/shoulder pain.  No LOC. No blood thinners. Has headache. Some pain to left hip.  No redness or bruising to right hip.

## 2020-04-22 NOTE — ED Notes (Signed)
Patient transported to CT by CT Tech

## 2020-04-22 NOTE — ED Provider Notes (Signed)
[facility] Emergency Department Provider Note  ____________________________________________   First MD Initiated Contact with Patient 04/22/20 1205     (approximate)  I have reviewed the triage vital signs and the nursing notes.   HISTORY  Chief Complaint Shoulder Pain and Fall    HPI Isabel Brown is a 39 y.o. female presents emergency department after falling down 15 steps that are carpeted at her sister's house.  States that she is unfamiliar with the house got up in the middle of the night and fell down the steps.  She is complaining of shoulder pain, headache, back pain, back pain.  Patient rates pain as 10 out of 10.  She read via EMS and they did place a c-collar offer.    History reviewed. No pertinent past medical history.  Patient Active Problem List   Diagnosis Date Noted  . IRITIS 04/26/2007  . 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 IRREGULAR MENSES 04/25/1994    Past Surgical History:  Procedure Laterality Date  . BREAST LUMPECTOMY      Prior to Admission medications   Medication Sig Start Date End Date Taking? Authorizing Provider  baclofen (LIORESAL) 10 MG tablet Take 1 tablet (10 mg total) by mouth 3 (three) times daily. 04/22/20 04/22/21  Sherrie Mustache Roselyn Bering, PA-C  HYDROcodone-acetaminophen (NORCO/VICODIN) 5-325 MG tablet Take 1 tablet by mouth every 6 (six) hours as needed for moderate pain. 04/22/20   Reeda Soohoo, Roselyn Bering, PA-C  ibuprofen (ADVIL,MOTRIN) 800 MG tablet Take 1 tablet (800 mg total) by mouth 3 (three) times daily. 03/21/15   Betancourt, Jarold Song, NP  meloxicam (MOBIC) 15 MG tablet Take 1 tablet (15 mg total) by mouth daily. 04/22/20 04/22/21  Sherrie Mustache Roselyn Bering, PA-C  oxyCODONE-acetaminophen (PERCOCET) 5-325 MG per tablet Take 1 -2 every 4-6 hours as needed for pain 03/24/15   Tommi Rumps, PA-C  oxyCODONE-acetaminophen (ROXICET) 5-325 MG per tablet Take 1 tablet by mouth every 4 (four) hours as needed for moderate  pain or severe pain. 04/01/15   Joni Reining, PA-C    Allergies Erythromycin and Erythromycin base  No family history on file.  Social History Social History   Tobacco Use  . Smoking status: Never Smoker  . Smokeless tobacco: Never Used  Substance Use Topics  . Alcohol use: No  . Drug use: Not on file    Review of Systems  Constitutional: No fever/chills Eyes: No visual changes. ENT: No sore throat. Respiratory: Denies cough Cardiovascular: Denies chest pain Gastrointestinal: Denies abdominal pain Genitourinary: Negative for dysuria. Musculoskeletal: Positive for shoulder, upper thigh, for back pain. Skin: Negative for rash. Psychiatric: no mood changes,     ____________________________________________   PHYSICAL EXAM:  VITAL SIGNS: ED Triage Vitals  Enc Vitals Group     BP 04/22/20 0547 (!) 130/103     Pulse Rate 04/22/20 0547 71     Resp 04/22/20 0547 (!) 26     Temp 04/22/20 0547 97.8 F (36.6 C)     Temp Source 04/22/20 1305 Oral     SpO2 04/22/20 0547 100 %     Weight 04/22/20 0552 270 lb (122.5 kg)     Height 04/22/20 0552 5\' 5"  (1.651 m)     Head Circumference --      Peak Flow --      Pain Score 04/22/20 1301 10     Pain Loc --      Pain Edu? --      Excl.  in GC? --     Constitutional: Alert and oriented. Well appearing and in no acute distress. Eyes: Conjunctivae are normal.  Head: Atraumatic. Nose: No congestion/rhinnorhea. Mouth/Throat: Mucous membranes are moist.   Neck:  supple no lymphadenopathy noted Cardiovascular: Normal rate, regular rhythm. Heart sounds are normal Respiratory: Normal respiratory effort.  No retractions, lungs c t a  Abd: soft nontender bs normal all 4 quad GU: deferred Musculoskeletal: Right shoulder strain tender to palpation, decreased range of motion, neurovascular is intact, T-spine is tender, right hip is tender, full range of motion of the hip neurovascular intact neurologic:  Normal speech and language.    Skin:  Skin is warm, dry and intact. No rash noted. Psychiatric: Mood and affect are normal. Speech and behavior are normal.  ____________________________________________   LABS (all labs ordered are listed, but only abnormal results are displayed)  Labs Reviewed - No data to display ____________________________________________   ____________________________________________  RADIOLOGY  CT chest without contrast, CT of the head, CT of the C-spine are all negative for any acute abnormalities X-ray of the T-spine, right elbow, right shoulder, and hip on the left are all negative for any acute abnormalities  ____________________________________________   PROCEDURES  Procedure(s) performed: No  Procedures    ____________________________________________   INITIAL IMPRESSION / ASSESSMENT AND PLAN / ED COURSE  Pertinent labs & imaging results that were available during my care of the patient were reviewed by me and considered in my medical decision making (see chart for details).   The patient is 39 year old female presents emergency department after falling down 15 steps.  See HPI.  Physical exam shows patient to be quite uncomfortable and tender to palpation in multiple areas.  Patient has been here for several hours and imaging have been ordered from triage to expedite her care.  DDx: TBI, right shoulder fracture, rotator cuff tear, spinal fracture, hip fracture  CT of the head, C-spine, and chest without contrast are all negative for any acute abnormalities  X-ray of the T-spine, right shoulder, right elbow, and left hip are negative for any acute abnormalities  I did explain everything to the patient.  Explained her she has multiple contusions as possible rotator cuff injury.  She is to follow-up with orthopedics if not improved in 1 week.  She is given a prescription for Vicodin.  Vicodin p.o. while here in the ED.  Placed in a sling and discharged in stable condition in  the care of her husband.     Isabel Brown was evaluated in Emergency Department on 04/22/2020 for the symptoms described in the history of present illness. She was evaluated in the context of the global COVID-19 pandemic, which necessitated consideration that the patient might be at risk for infection with the SARS-CoV-2 virus that causes COVID-19. Institutional protocols and algorithms that pertain to the evaluation of patients at risk for COVID-19 are in a state of rapid change based on information released by regulatory bodies including the CDC and federal and state organizations. These policies and algorithms were followed during the patient's care in the ED.    As part of my medical decision making, I reviewed the following data within the electronic MEDICAL RECORD NUMBER History obtained from family, Nursing notes reviewed and incorporated, Old chart reviewed, Radiograph reviewed , Notes from prior ED visits and [place] Controlled Substance Database  ____________________________________________   FINAL CLINICAL IMPRESSION(S) / ED DIAGNOSES  Final diagnoses:  Fall, initial encounter  Multiple contusions  NEW MEDICATIONS STARTED DURING THIS VISIT:  Discharge Medication List as of 04/22/2020  1:13 PM    START taking these medications   Details  baclofen (LIORESAL) 10 MG tablet Take 1 tablet (10 mg total) by mouth 3 (three) times daily., Starting Mon 04/22/2020, Until Tue 04/22/2021, Normal    HYDROcodone-acetaminophen (NORCO/VICODIN) 5-325 MG tablet Take 1 tablet by mouth every 6 (six) hours as needed for moderate pain., Starting Mon 04/22/2020, Normal    meloxicam (MOBIC) 15 MG tablet Take 1 tablet (15 mg total) by mouth daily., Starting Mon 04/22/2020, Until Tue 04/22/2021, Normal         Note:  This document was prepared using Dragon voice recognition software and may include unintentional dictation errors.    Faythe Ghee, PA-C 04/22/20 1833    Minna Antis,  MD 04/23/20 [phone]

## 2020-04-22 NOTE — ED Notes (Signed)
Patient transported to X-ray 

## 2020-04-22 NOTE — Discharge Instructions (Signed)
Follow-up with your regular doctor as needed.  Return emergency department worsening.  Follow-up with orthopedics if not improving and 5 to 7 days.  Medication as prescribed

## 2020-08-25 ENCOUNTER — Ambulatory Visit: Admitting: Emergency Medicine

## 2020-08-25 LAB — HX COVID19 BY PCR (LGH)
CASE NUMBER: 2021360001034
HX COVID19 BY PCR: NOT DETECTED

## 2020-12-27 ENCOUNTER — Other Ambulatory Visit (HOSPITAL_BASED_OUTPATIENT_CLINIC_OR_DEPARTMENT_OTHER): Admitting: Physician Assistant

## 2020-12-30 ENCOUNTER — Inpatient Hospital Stay (HOSPITAL_BASED_OUTPATIENT_CLINIC_OR_DEPARTMENT_OTHER): Admit: 2020-12-30

## 2020-12-30 ENCOUNTER — Inpatient Hospital Stay (HOSPITAL_BASED_OUTPATIENT_CLINIC_OR_DEPARTMENT_OTHER): Admit: 2020-12-30 | Discharge: 2020-12-30 | Disposition: A | Discharge status: Home / Self Care

## 2020-12-30 ENCOUNTER — Encounter

## 2020-12-30 DIAGNOSIS — M472 Other spondylosis with radiculopathy: Secondary | ICD-10-CM

## 2021-07-10 ENCOUNTER — Ambulatory Visit
Admit: 2021-07-10 | Discharge: 2021-07-10 | Payer: PRIVATE HEALTH INSURANCE | Attending: Physical Medicine & Rehabilitation | Primary: Family Medicine

## 2021-07-10 ENCOUNTER — Encounter (INDEPENDENT_AMBULATORY_CARE_PROVIDER_SITE_OTHER): Admitting: Physical Medicine & Rehabilitation

## 2021-07-10 ENCOUNTER — Other Ambulatory Visit

## 2021-07-10 VITALS — Ht 65.0 in | Wt 270.0 lb

## 2021-07-10 DIAGNOSIS — M5416 Radiculopathy, lumbar region: Secondary | ICD-10-CM

## 2021-07-10 NOTE — Progress Notes (Signed)
 [facility]  [facility]  [address], [address]  [place] [address]  Dept: [phone]  Dept Fax: [phone]      Chief complaint/History of Present Illness:  Linda Conley is a pleasant 40 y.o. year old female who presents for ongoing of her LEFT sided low back pain radiating into the outside of the LEFT hip.Linda Conley rates her pain as 8/10 VAS at its worst.  she describes the pain as discomfort like, and tight, as if something is out of place..  The pain can be exacerbated by bending backwards while alleviated by time.  Symptoms are worse when sitting for long periods of time.     The patient had an L3-4 interlaminar epidural steroid injection on 6/29/222 that 100% relieved her pain through mid August when she started to feel the same pain radiating into the LEFT thigh again.    She has continued her home exercise program daily.  She recently discontinued her gabapentin as she has been able to function without it.  She continues to feel limited and holds herself back from activity secondary to the pain.    History:  Past Medical History:   Diagnosis Date   ? Back pain    ? Cancer (CMS/HCC)      Past Surgical History:   Procedure Laterality Date   ? BREAST SURGERY N/A    ? COLON SURGERY       Family History   Problem Relation Name Age of Onset   ? Lumbar disc disease Mother     ? Lumbar disc disease Father       Social History     Socioeconomic History   ? Marital status: Married     Spouse name: None   ? Number of children: None   ? Years of education: None   ? Highest education level: None   Occupational History   ? None   Tobacco Use   ? Smoking status: Never   ? Smokeless tobacco: Never   Substance and Sexual Activity   ? Alcohol use: None   ? Drug use: None   ? Sexual activity: None   Other Topics Concern   ? None   Social History Narrative   ? None     Social Determinants of Health     Financial Resource Strain: Not on file   Food Insecurity: Not on file   Transportation Needs: Not on  file   Physical Activity: Not on file   Stress: Not on file   Social Connections: Not on file   Intimate Partner Violence: Not on file   Housing Stability: Not on file        Erythromycin    Medications:  Current Outpatient Medications   Medication Instructions   ? albuterol (ProAir HFA) 90 mcg/actuation inhaler ProAir HFA 90 mcg/actuation aerosol inhaler   ? benzonatate (Tessalon) 200 mg capsule benzonatate 200 mg capsule   ? clindamycin (Cleocin T) 1 % external solution Daily   ? codeine-guaifenesin (Robitussin-AC) 10-100 mg/5 mL syrup 10 mL, Every 8 hours   ? codeine-guaifenesin (Robitussin-AC) 10-100 mg/5 mL syrup 10 mL, oral, 3 times daily PRN   ? gabapentin (NEURONTIN) 300 mg, oral, 3 times daily   ? ondansetron ODT (Zofran-ODT) 4 mg disintegrating tablet ondansetron 4 mg disintegrating tablet   ? traZODone (DESYREL) 150 mg, oral, Nightly PRN       Review of Systems:  Pertinent items are noted in HPI.    Height:  1.651 m Weight: 122 kg BMI: 44.93 kg/m?    PHYSICAL EXAMINATION:  GENERAL: well appearing, alert and oriented x3, appropriate mood/affect in no acute distress  CVS: peripheral pulses are intact and symmetric  CHEST: symmetric chest rise, no shortness of breath  HEENT: NC/AT  SKIN: No acute lesions affecting the face, trunk or upper/lower extremities  FEET/ANKLES: no cyanosis, clubbing, edema  HEAD/NECK, SPINE/RIBS/PELVIS, UPPER LIMBS: no misalignment/instability. ROM and upper extremity muscle strength are grossly intact  GAIT/STATION: normal  SPINE: The pelvis is level.    STRENGTH: 4+/5 in the LEFT EHL while the right rates 5/5.  Otherwise, 5/5 bilaterally in DF, PF, hip abduction and adduction  REFLEXES: Equal and symmetric bilaterally in the quadriceps and Achilles.  No sustained clonus could be elicited in either ankle on jerk.  CEREBELLAR: Coordination is grossly intact      Assessment/Plan:    Spinal anatomy using the patient's imaging as an example was reviewed.  Management including physical  therapy, spinal injections, and surgery was discussed.  A stretching and exercise program was presented and encouraged.    Problem List Items Addressed This Visit        Nervous    Lumbar radiculopathy - Primary    Current Assessment & Plan     12/30/20:  MRI LS spine performed at [facility].  no acute fracture or listhesis.  No loss of vertebral body height.  Disc space narrowing in the L4-5 disc.  Mild to moderate endplate degenerative changes. Conus medullaris ends at L1-2.   T12-L3: unremarkable  L3-4: minimal disc bulge and mild bilateral facet joint degenerative changes without central spinal stenosis  L4-5: moderate central spinal stenosis secondary to a broad-based right eccentric posterior disc extrusion and mild bilateral facet joint degenerative changes.  There is severe right subarticular recess stenosis with compression of the traversing right L5 nerve root.  Disc extrusion measures 0.7x1.3x1.3cm.  L5-S1: mild broad-based posterior central disc protrusion and mild bilateral facet joint degenerative changes without central spinal stenosis  01/16/2021: L3-4 interlaminar epidural steroid injection with 100% relief of symptoms in her thigh but she is still experiencing pain in her low back and lower leg  02/26/2021: L3-4 interlaminar epidural steroid injection with 100% relief through mid August  07/10/2021:   The patient had >60% relief from the previous injection.  The patient had improved functional mobility during the post procedural injection relief period and was able to decrease oral medication intake.  The patient strictly adhered to the previously provided supervised home exercise program, since the last injection without long term benefit.  Hence, it is medically necessary to order: #3 L3-4 interlaminar epidural steroid injection.    If she finds a return in her pain after this, I will most likely order a new MRI for further evaluation.        Risks and benefits were explained.  All questions were  answered.   The consent form will be signed immediately prior to the procedure.

## 2021-07-10 NOTE — Assessment & Plan Note (Signed)
 12/30/20:  MRI LS spine performed at [facility].  no acute fracture or listhesis.  No loss of vertebral body height.  Disc space narrowing in the L4-5 disc.  Mild to moderate endplate degenerative changes. Conus medullaris ends at L1-2.   T12-L3: unremarkable  L3-4: minimal disc bulge and mild bilateral facet joint degenerative changes without central spinal stenosis  L4-5: moderate central spinal stenosis secondary to a broad-based right eccentric posterior disc extrusion and mild bilateral facet joint degenerative changes.  There is severe right subarticular recess stenosis with compression of the traversing right L5 nerve root.  Disc extrusion measures 0.7x1.3x1.3cm.  L5-S1: mild broad-based posterior central disc protrusion and mild bilateral facet joint degenerative changes without central spinal stenosis  01/16/2021: L3-4 interlaminar epidural steroid injection with 100% relief of symptoms in her thigh but she is still experiencing pain in her low back and lower leg  02/26/2021: L3-4 interlaminar epidural steroid injection with 100% relief through mid August  07/10/2021:   The patient had >60% relief from the previous injection.  The patient had improved functional mobility during the post procedural injection relief period and was able to decrease oral medication intake.  The patient strictly adhered to the previously provided supervised home exercise program, since the last injection without long term benefit.  Hence, it is medically necessary to order: #3 L3-4 interlaminar epidural steroid injection.    If she finds a return in her pain after this, I will most likely order a new MRI for further evaluation.

## 2021-07-11 ENCOUNTER — Encounter: Payer: PRIVATE HEALTH INSURANCE | Attending: Specialist | Primary: Family Medicine

## 2021-07-17 ENCOUNTER — Encounter: Payer: PRIVATE HEALTH INSURANCE | Attending: Specialist | Primary: Family Medicine

## 2021-07-18 ENCOUNTER — Ambulatory Visit (INDEPENDENT_AMBULATORY_CARE_PROVIDER_SITE_OTHER)

## 2021-07-18 ENCOUNTER — Other Ambulatory Visit (INDEPENDENT_AMBULATORY_CARE_PROVIDER_SITE_OTHER): Admitting: Specialist

## 2021-07-18 ENCOUNTER — Other Ambulatory Visit

## 2021-07-18 ENCOUNTER — Encounter (INDEPENDENT_AMBULATORY_CARE_PROVIDER_SITE_OTHER): Admitting: Specialist

## 2021-07-18 ENCOUNTER — Ambulatory Visit
Admit: 2021-07-18 | Discharge: 2021-07-18 | Payer: PRIVATE HEALTH INSURANCE | Attending: Specialist | Primary: Family Medicine

## 2021-07-18 DIAGNOSIS — M25562 Pain in left knee: Secondary | ICD-10-CM

## 2021-07-18 DIAGNOSIS — S83412A Sprain of medial collateral ligament of left knee, initial encounter: Secondary | ICD-10-CM

## 2021-07-18 NOTE — Progress Notes (Signed)
[facility]  [address], [address]  [place] [address]  Dept: [phone]  Dept Fax: [phone]     HPI  Linda Conley is a 40 y.o. female who presents for left knee pain. She states the pain started 2 months ago when she tripped over a gas line. She does have some swelling and bruising. She states pain level 2/10, her pain is intermittent and localized.  She is having difficulty bending fully and straightening fully.    Exam  Height: 1.651 m Weight: 122 kg BMI: 44.93 kg/m    On exam  she is well-appearing and in no acute distress.  Linda Conley is  alert and oriented x3.  Stands erect, is ambulating without assistive devices and is walking without a limp.    LEFT KNEE EXAMINATION- INSPECTION:  Alignment is normal. There is no effusion.  There is no swelling.  There is no warmth.  There is no ecchymosis.  There is no erythema.  PALPATION:  Joint line tenderness of the medial joint .  Tenderness also present along the entire course of the MCL.  The calf is non-tender. SKIN:  intact. VASCULAR : intact. SENSATION: intact. ROM:  0 To 125 with discomfort on full flexion.  Patella translation is 1+ medially and 1+ laterally  without pain. STRENGTH: Flexion 5/5. Extension 5/5. STABILITY:  stable to varus valgus stress, negative anterior drawer and posterior drawer and negative Lachman.  Mild discomfort with valgus stress.  Apleys test is -ve. Mcmurrays test is -ve. Examination of the hip reveals no pain with joint rotation.    Diagnostic Tests  XR KNEE LEFT 3 VIEWS  Imaging Result: 07/18/2021    A left knee xray is performed in standing AP, Lateral, and Sunrise views.   There is no joint space narrowing. No bone spur formation, Knee alignment   is normal. There are no bone lesions.      Assessment - left knee xray normal appearing      Assessment & Plan  MCL sprain left knee.  I talked her at length about this.  I cannot rule out a meniscal tear but I think that is less likely.  I recommended physical therapy for  her.  She can work on range of motion and strengthening with cycling.  I will see her back in the office in a month.  1. Sprain of medial collateral ligament of left knee, initial encounter        Past Medical History:   Diagnosis Date   . Back pain    . Cancer (CMS/HCC)        Past Surgical History:   Procedure Laterality Date   . BREAST SURGERY N/A    . COLON SURGERY         Social History     Occupational History   . Not on file   Tobacco Use   . Smoking status: Never   . Smokeless tobacco: Never   Substance and Sexual Activity   . Alcohol use: Not Currently   . Drug use: Never   . Sexual activity: Defer       Current Outpatient Medications   Medication Instructions   . albuterol (ProAir HFA) 90 mcg/actuation inhaler ProAir HFA 90 mcg/actuation aerosol inhaler   . benzonatate (Tessalon) 200 mg capsule benzonatate 200 mg capsule   . clindamycin (Cleocin T) 1 % external solution Daily   . codeine-guaifenesin (Robitussin-AC) 10-100 mg/5 mL syrup 10 mL, Every 8 hours   . codeine-guaifenesin (Robitussin-AC) 10-100  mg/5 mL syrup 10 mL, oral, 3 times daily PRN   . gabapentin (NEURONTIN) 300 mg, oral, 3 times daily   . ondansetron ODT (Zofran-ODT) 4 mg disintegrating tablet ondansetron 4 mg disintegrating tablet   . traZODone (DESYREL) 150 mg, oral, Nightly PRN       Allergies   Allergen Reactions   . Erythromycin      Other reaction(s): Other (See Comments)  Vomiting and diarrhea             Theda Sers, MD

## 2021-07-21 ENCOUNTER — Telehealth (INDEPENDENT_AMBULATORY_CARE_PROVIDER_SITE_OTHER)

## 2021-07-21 NOTE — Telephone Encounter (Signed)
Procedure Date: 07/30/21    Procedure:   L3-4 Interlaminar Epidural Steroid Injection     Reviewed medications:  NO ANTICOAGULANTS IDENTIFIED     Med Hold:    No      Contrast Allergy:   No      Sent letter to cardiology/PCP:  No     Marlan Palau, MD        Sent patient Pre-Procedure instructions:   Yes    Mail    Hillis Range, LPN

## 2021-07-30 ENCOUNTER — Other Ambulatory Visit

## 2021-07-30 ENCOUNTER — Ambulatory Visit
Admit: 2021-07-30 | Discharge: 2021-07-30 | Payer: PRIVATE HEALTH INSURANCE | Attending: Physical Medicine & Rehabilitation | Primary: Family Medicine

## 2021-07-30 VITALS — BP 122/80

## 2021-07-30 DIAGNOSIS — M5416 Radiculopathy, lumbar region: Secondary | ICD-10-CM

## 2021-07-30 NOTE — Progress Notes (Signed)
 [address]  [place], [place] [address]    [address]  [place], [place] [address]    Phone: [phone]  Fax:      [phone]  E-mail: [email or web address]       FLUOROSCOPICALLY GUIDED LUMBAR L3-4 EPIDURAL STEROID INJECTION  The procedure was explained. Risks, benefits and alternatives were explained. A consent form was signed. The patient was placed prone on the exam table. The area overlying the lumbosacral spine was exposed and prepared using a chlorhexidine swabs. This area was then allowed to dry and draped. The entry point overlying the L3-4 interlaminar area was identified using both anatomic landmarks and fluoroscopy. A skin wheal was raised at the entry point location using preservative free 1% lidocaine without epinephrine delivered via a 1.5" 2g skin needle. The needle was then inserted and directed toward the interlaminar area. As the needle was withdrawn from each location, 1 mL of preservative free 1% lidocaine without epinephrine was injected. A 20g 3.5" Tuohy was then inserted and directed toward the interlaminar area using intermittent fluoroscopy in AP, lateral, and 50 degree contralateral oblique views. The stylus was removed and a loss of resistance syringe was connected to the needle. Then, using the loss of resistance technique to saline the needle was advanced to the epidural space. The location was confirmed through injection of Omnipaque 240 mg/mL AP, lateral, and 50 degree contralateral oblique views were saved to the PACS, as appropriate. Then, 2 mL of normal saline mixed with 1 mL of 6 mg/mL betamethasonewas injected into the epidural space. The needle was then withdrawn. The skin was cleansed and a sterile adhesive bandage was placed overlying the entry site. All aspirates were negative. There were no immediate complications. The patient ambulated to the recovery area without difficulty.      Recovery and Discharge: After the procedure was completed, Christyne was  transferred to the recovery area and observed by our recovery staff. The discharge instructions and post procedural pain logs were reviewed.  Red flag findings were discussed with the patient as a reason for emergent evaluation.      Complications:  No immediate procedural complication noted.    Follow-up:  14-28 days post procedure.

## 2021-08-07 ENCOUNTER — Encounter: Payer: PRIVATE HEALTH INSURANCE | Attending: Specialist | Primary: Family Medicine

## 2021-08-07 ENCOUNTER — Other Ambulatory Visit

## 2021-08-07 ENCOUNTER — Encounter (INDEPENDENT_AMBULATORY_CARE_PROVIDER_SITE_OTHER)

## 2021-08-07 ENCOUNTER — Inpatient Hospital Stay
Admit: 2021-08-07 | Disposition: A | Discharge status: Home / Self Care | Payer: PRIVATE HEALTH INSURANCE | Attending: Family Medicine | Primary: Family Medicine

## 2021-08-07 VITALS — BP 108/75 | HR 69 | Temp 98.2°F | Resp 16 | Ht 65.0 in | Wt 270.0 lb

## 2021-08-07 DIAGNOSIS — H5712 Ocular pain, left eye: Secondary | ICD-10-CM

## 2021-08-07 MED ORDER — fluorescein 1 mg ophthalmic strip  - Omnicell Override Pull
1 | OPHTHALMIC | Status: AC
Start: 2021-08-07 — End: ?

## 2021-08-07 MED FILL — FLUORESCEIN 1 MG EYE STRIPS: 1 1 mg | OPHTHALMIC | Qty: 1

## 2021-08-07 NOTE — ED Provider Notes (Signed)
 History  Chief Complaint   Patient presents with   . Eye Problem     Pt c/o right eye pain, red, and sensitive to light,pt states "I need a steroid drop"     Left eye pain and redness since yesterday.  She also reports photophobia but denies any visual changes or discharge.  No history of trauma to the eye.  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  She previously lived in [place] and [place].  She has been treated with steroid drops and dilatation in the past when she has had an episode of iritis.  No underlying etiology for these episodes has been uncovered in previous work-ups.          Past Medical History:   Diagnosis Date   . Anxiety    . Back pain    . Cancer (CMS/HCC)        Past Surgical History:   Procedure Laterality Date   . BREAST SURGERY N/A    . COLON SURGERY         Family History   Problem Relation Name Age of Onset   . Lumbar disc disease Mother     . Lumbar disc disease Father         Social History     Tobacco Use   . Smoking status: Never   . Smokeless tobacco: Never   Substance Use Topics   . Alcohol use: Not Currently   . Drug use: Never       Review of Systems   Eyes: Positive for photophobia, pain and redness. Negative for discharge, itching and visual disturbance.       Physical Exam  Vitals:    08/07/21 0808   BP: 108/75   Pulse: 69   Resp: 16   Temp: 36.8 C (98.2 F)   TempSrc: Oral   SpO2: 97%   Weight: 122 kg   Height: 1.651 m       Physical Exam  Vitals and nursing note reviewed.   Constitutional:       Appearance: Normal appearance.   HENT:      Head: Normocephalic.   Eyes:      Comments: PERRL, EOMI, moderate conjunctival injection of the medial aspect of her left eye.  No foreign bodies noted on visual inspection.  Optic disks flat with sharp margins and no hemorrhages or exudates.  Fluorescein exam reveals no increased uptake.   Musculoskeletal:          General: Normal range of motion.      Cervical back: Normal range of motion and neck supple.   Skin:     General: Skin is warm and dry.   Neurological:      General: No focal deficit present.      Mental Status: She is alert and oriented to person, place, and time.   Psychiatric:         Mood and Affect: Mood normal.         Behavior: Behavior normal.         Thought Content: Thought content normal.         Judgment: Judgment normal.         Visual Acuity  Bilateral Distance: 20/15  R Distance: 20/15  L Distance: 20/15        No orders  to display     Labs Reviewed - No data to display    Procedures  Procedures    UC Course  Diagnoses as of 08/07/21 0831   Eye pain, left       MDM  Number of Diagnoses or Management Options  Eye pain, left  Diagnosis management comments: Her history and exam is suggestive of iritis.  Conjunctivitis is less likely given the lack of discharge.  Her visual acuity is normal.  I discussed with her that without a slit lamp exam, which is not possible at this facility, I cannot rule out HSV and do not feel comfortable prescribing steroid drops.  She requires a more extensive evaluation then it is possible in the urgent care setting.  She was referred to the ophthalmologist on-call, Dr. Oletta Lamas, and will call his office when it opens up this morning in 45 minutes and schedule an urgent consultation.  She verbalizes understanding of and agreement with the plan.    Patient Progress  Patient progress: stable      Discharge Meds  ED Prescriptions    None         Home Meds  Prior to Admission medications    Medication Sig Start Date End Date Taking? Authorizing Provider   sertraline (Zoloft) 50 mg tablet Take 50 mg by mouth in the morning. 07/16/21  Yes Nurse [facility] Emergency, RN   traZODone (Desyrel) 150 mg tablet Take 150 mg by mouth if needed at bedtime. 06/03/21  Yes Historical Provider, MD   albuterol 90 mcg/actuation inhaler ProAir HFA 90 mcg/actuation aerosol inhaler    Historical  Provider, MD   benzonatate (Tessalon) 200 mg capsule benzonatate 200 mg capsule    Historical Provider, MD   clindamycin (Cleocin T) 1 % external solution in the morning. 06/07/21   Historical Provider, MD   codeine-guaifenesin (Robitussin-AC) 10-100 mg/5 mL syrup 10 mL every 8 (eight) hours.    Historical Provider, MD   codeine-guaifenesin (Robitussin-AC) 10-100 mg/5 mL syrup Take 10 mL by mouth if needed in the morning, at noon, and at bedtime. 06/19/21   Historical Provider, MD   gabapentin (Neurontin) 300 mg capsule Take 300 mg by mouth in the morning, at noon, and at bedtime. 01/22/21   Historical Provider, MD   ondansetron ODT (Zofran-ODT) 4 mg disintegrating tablet ondansetron 4 mg disintegrating tablet    Historical Provider, MD         Total amount of time spent on day of service doing chart review, history and physical exam, order/ review results of testing ordered (if any), patient counseling, documentation: 20 minutes.      Patient encounter note may have been created using voice recognition software and in real time during the office visit. Please excuse any typographical errors that may not have been edited out.               Romona Curls, MD  08/07/21 [phone]

## 2021-08-07 NOTE — Discharge Instructions (Signed)
 Please call Dr. Marca Ancona office at 9 AM to schedule an urgent consultation today.

## 2021-08-14 ENCOUNTER — Encounter: Payer: PRIVATE HEALTH INSURANCE | Attending: Family | Primary: Family Medicine

## 2021-08-20 IMAGING — CT CT HEAD W/O CM
3 series · 17 of 37 positions shown, 19 images · non-contrast
Comparison: None.

CLINICAL DATA: Fall.

EXAM:
CT HEAD WITHOUT CONTRAST
CT CERVICAL SPINE WITHOUT CONTRAST
TECHNIQUE: Multidetector CT imaging of the head and cervical spine was
performed following the standard protocol without intravenous
contrast. Multiplanar CT image reconstructions of the cervical spine
were also generated.

[Series 2: head bone · axial · 0.41mm/px · z∈[-67,+43]mm · 7 of 79 slices shown]
[im 8/79  bone]
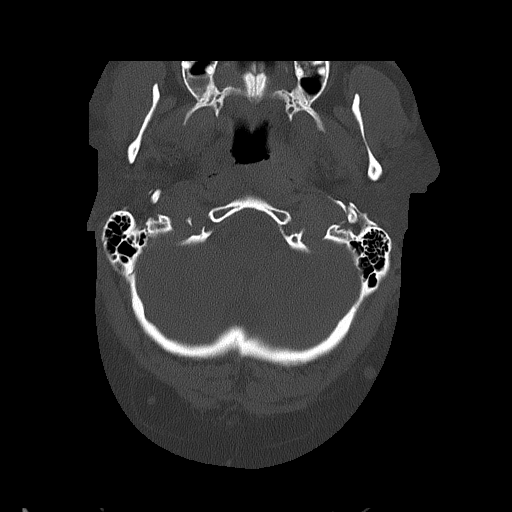
[im 16/79  bone]
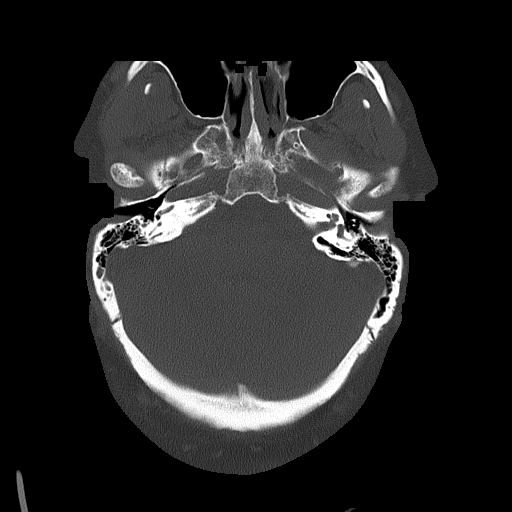
[im 24/79  bone]
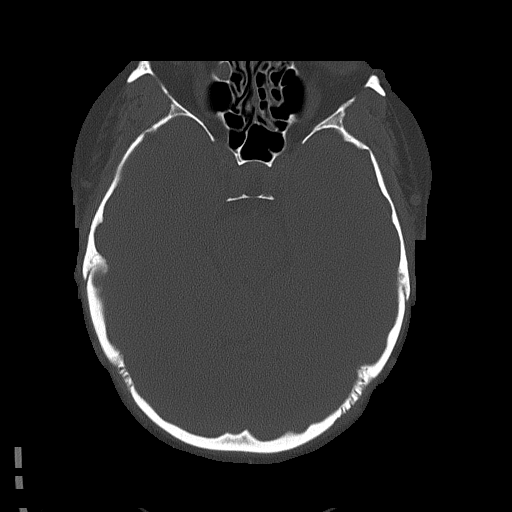
[im 36/79  bone]
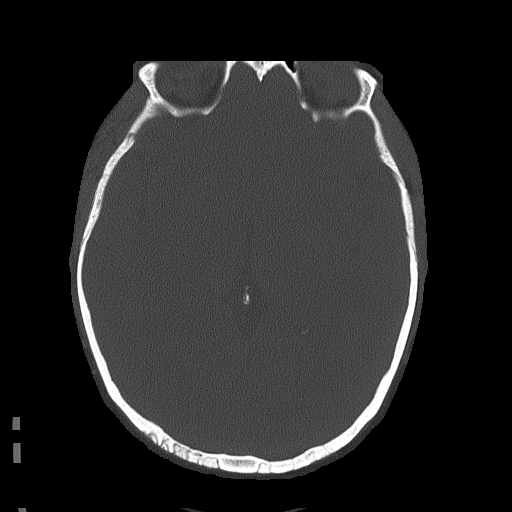
[im 43/79  bone]
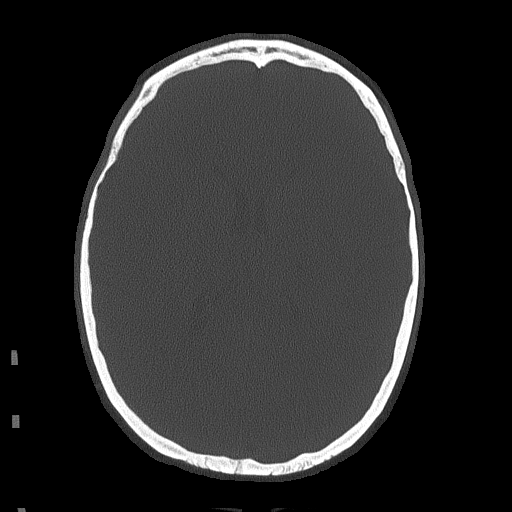
[im 55/79  bone]
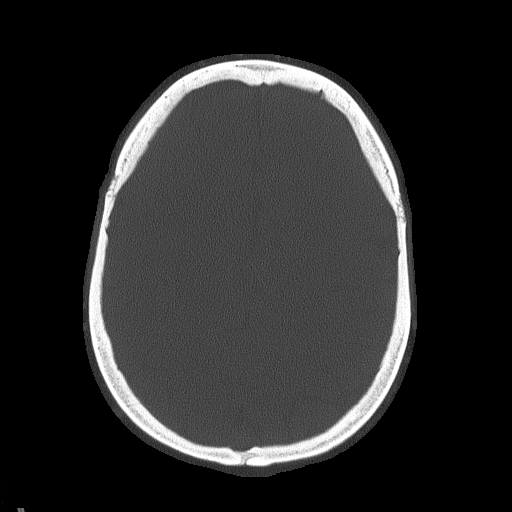
[im 63/79  bone]
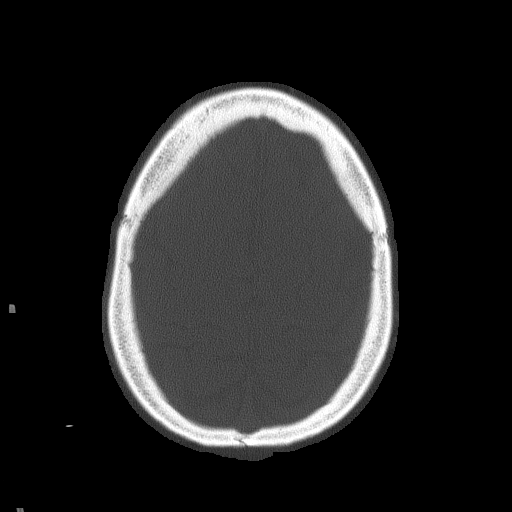

[Series 3: head wo · axial · 0.41mm/px · z∈[-66,+54]mm · 7 of 32 slices shown, 9 images]
[im 4/32  brain]
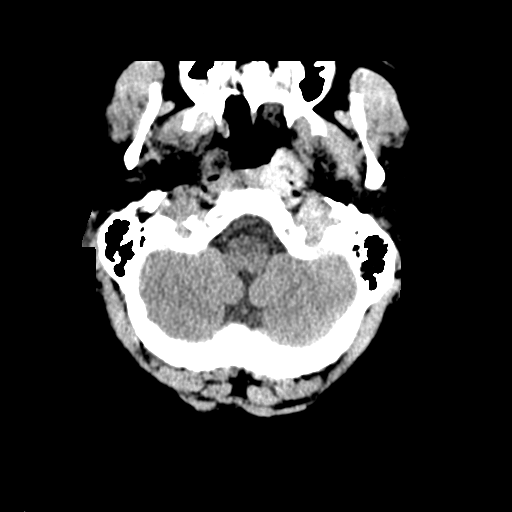
[im 4/32  bone]
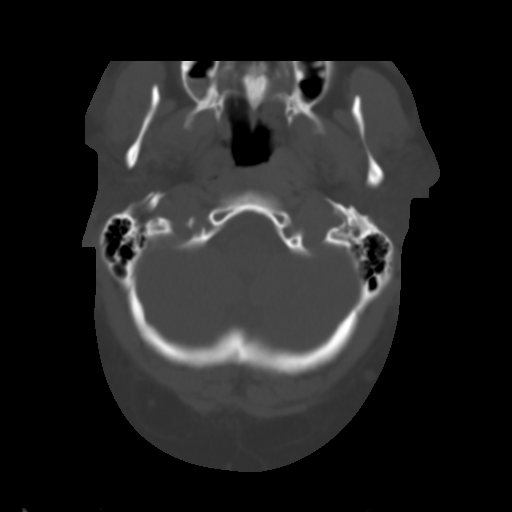
[im 8/32  brain]
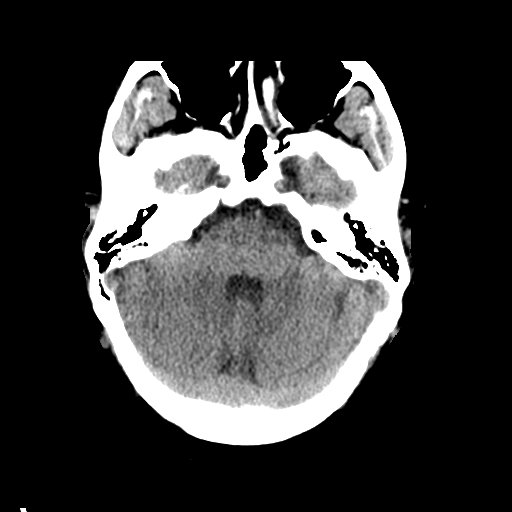
[im 12/32  brain]
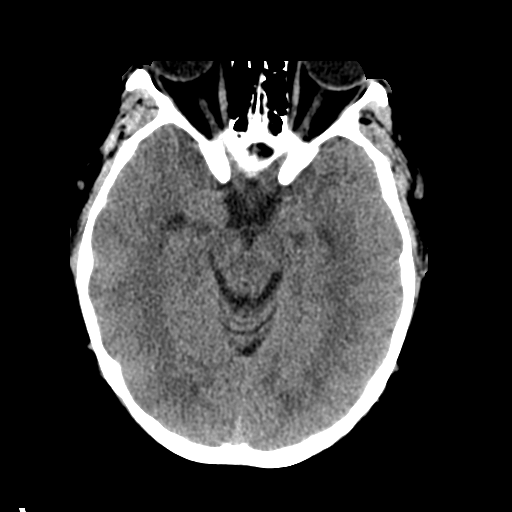
[im 16/32  brain]
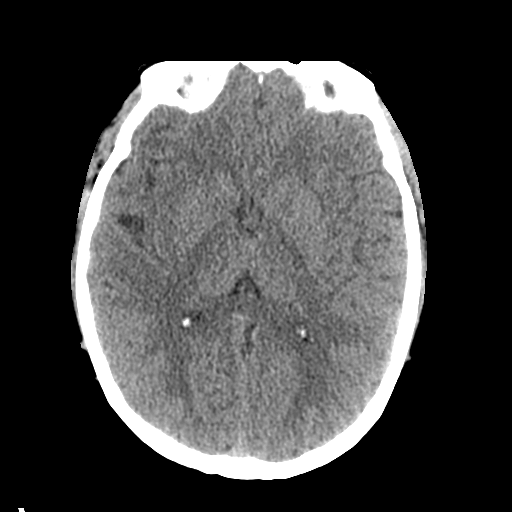
[im 20/32  brain]
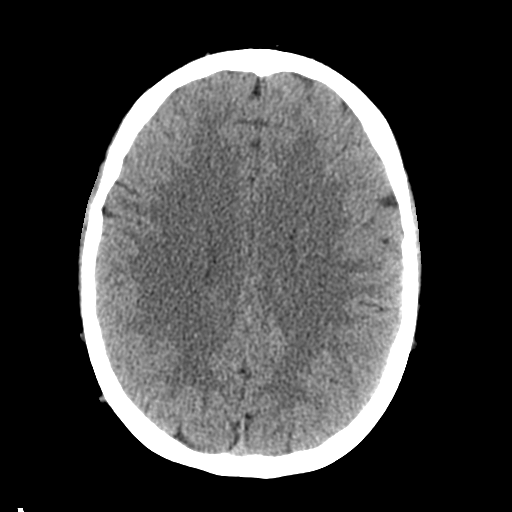
[im 20/32  bone]
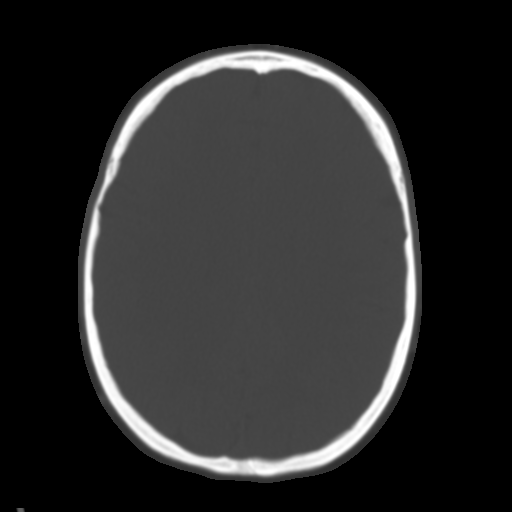
[im 24/32  brain]
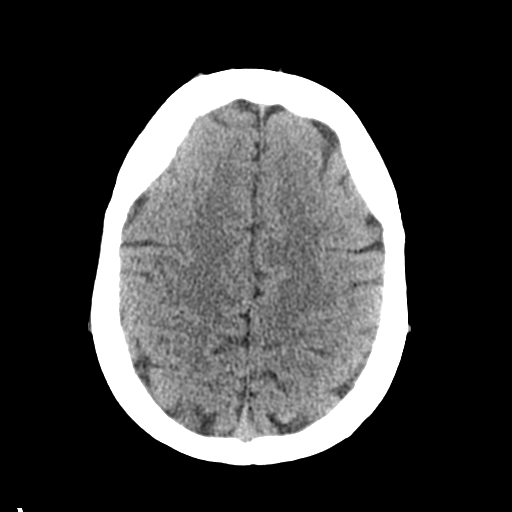
[im 28/32  brain]
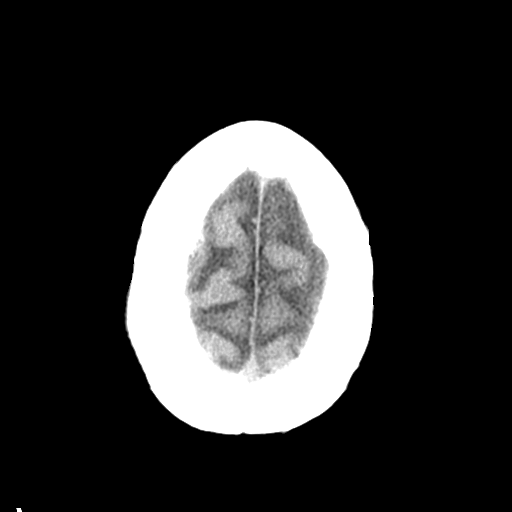

[Series 5: sagittal soft tissue · sagittal · 0.31mm/px · 3 of 53 slices shown]
[im 18/53  brain]
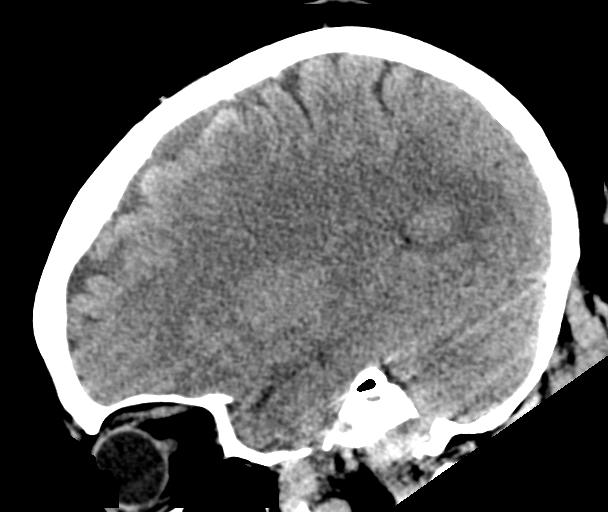
[im 27/53  brain]
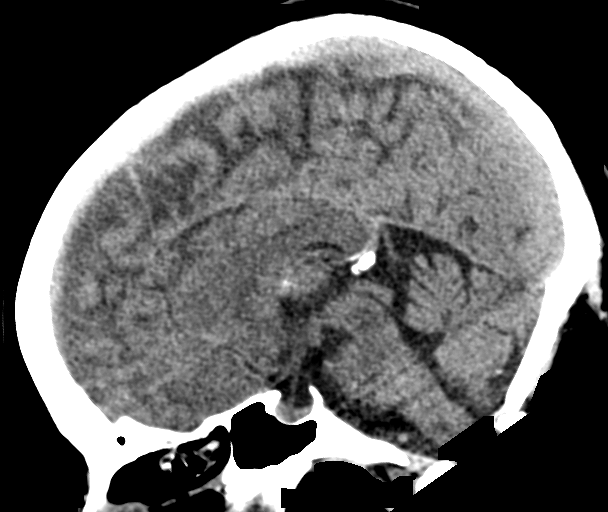
[im 35/53  brain]
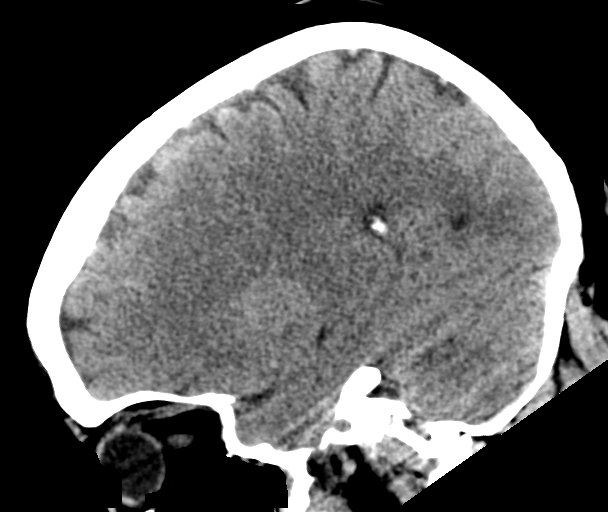

[17 of 37 positions shown; findings below may reference images not displayed]

FINDINGS: CT HEAD FINDINGS

Brain: No evidence of acute infarction, hemorrhage, hydrocephalus,
extra-axial collection or mass lesion/mass effect.

Vascular: No hyperdense vessel or unexpected calcification.

Skull: Normal. Negative for fracture or focal lesion.

Sinuses/Orbits: Retention cyst versus polyp noted in the right
maxillary sinus. No significant mucosal thickening. No sinus fluid
levels. Mastoid air cells appear clear.

Other: None

CT CERVICAL SPINE FINDINGS

Alignment: Normal.

Skull base and vertebrae: No acute fracture. No primary bone lesion
or focal pathologic process.

Soft tissues and spinal canal: No prevertebral fluid or swelling. No
visible canal hematoma.

Disc levels:  Unremarkable.

Upper chest: Negative.

Other: None
IMPRESSION: 1. No acute intracranial abnormalities.
2. No evidence for cervical spine fracture.

## 2021-08-20 IMAGING — CT CT CHEST W/O CM
2 of 3 series · 14 of 36 positions shown, 17 images · non-contrast
Comparison: None.

CLINICAL DATA: Chest trauma, fall down stairs, right-sided chest
pain

EXAM:
CT CHEST WITHOUT CONTRAST
TECHNIQUE: Multidetector CT imaging of the chest was performed following the
standard protocol without IV contrast.

[Series 2: thorax · axial · 0.77mm/px · z∈[-280,-58]mm · 11 of 131 slices shown, 14 images]
[im 10/131  mediastinal]
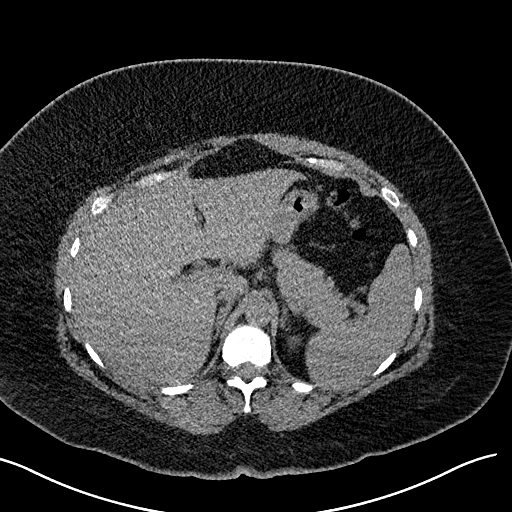
[im 10/131  lung]
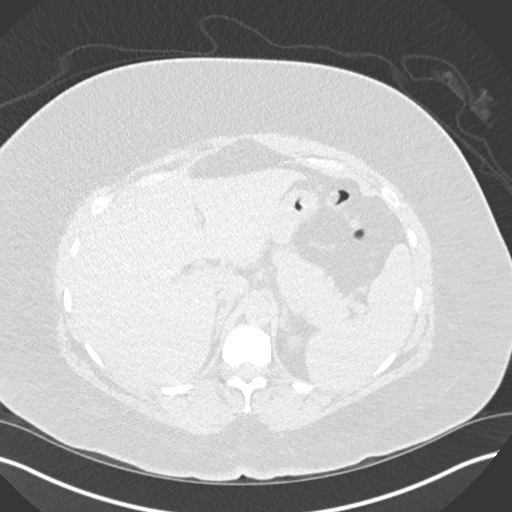
[im 20/131  lung]
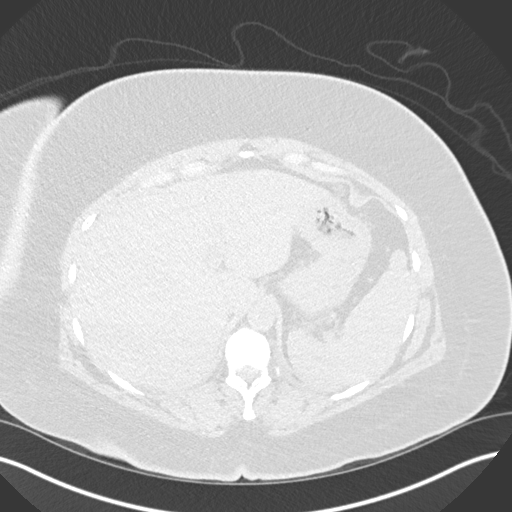
[im 29/131  lung]
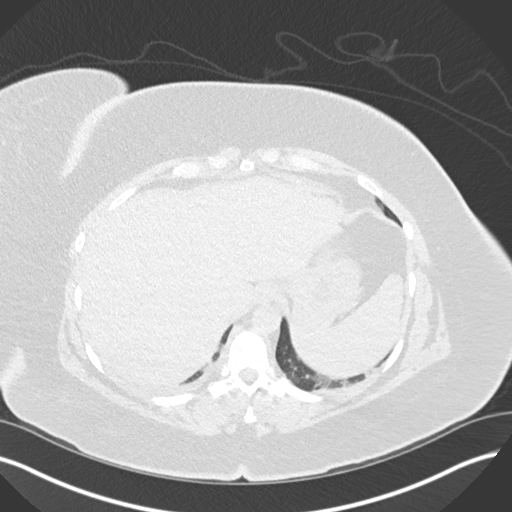
[im 44/131  lung]
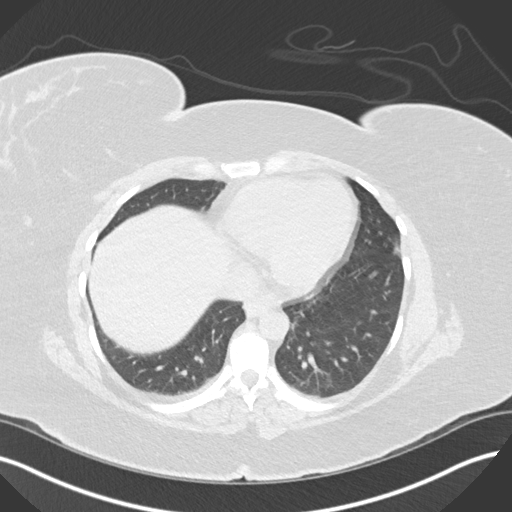
[im 53/131  mediastinal]
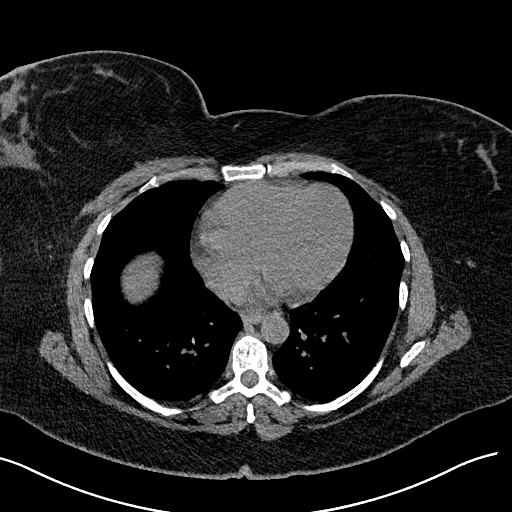
[im 53/131  lung]
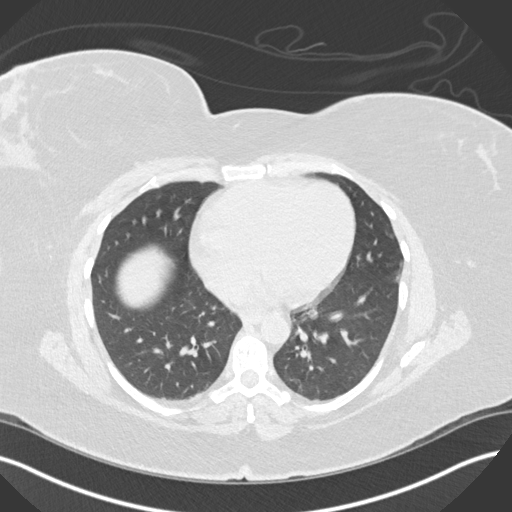
[im 68/131  lung]
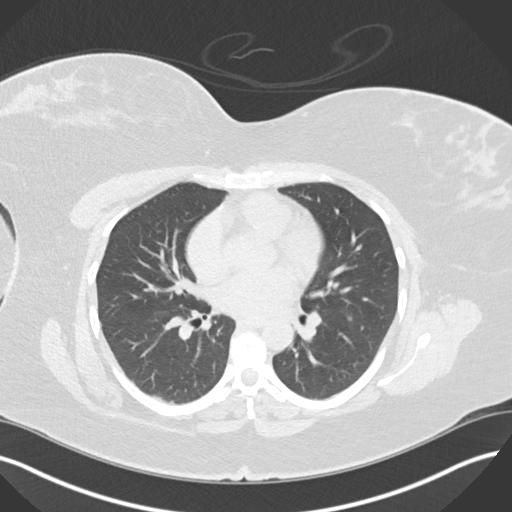
[im 78/131  lung]
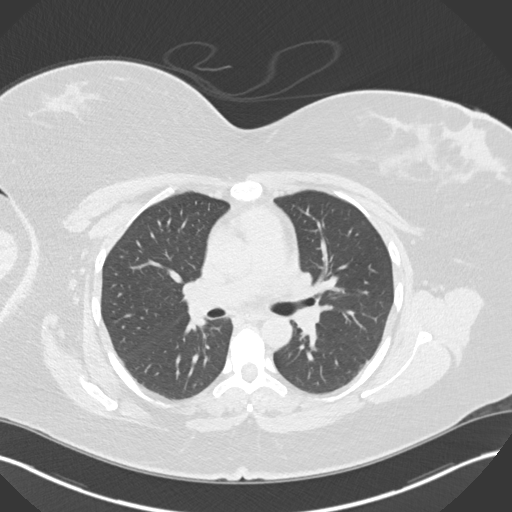
[im 87/131  lung]
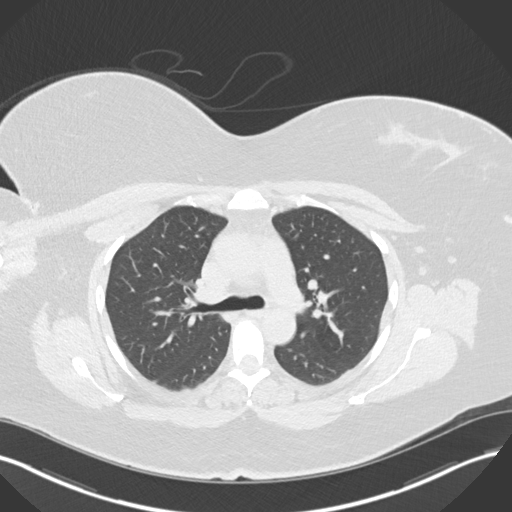
[im 102/131  mediastinal]
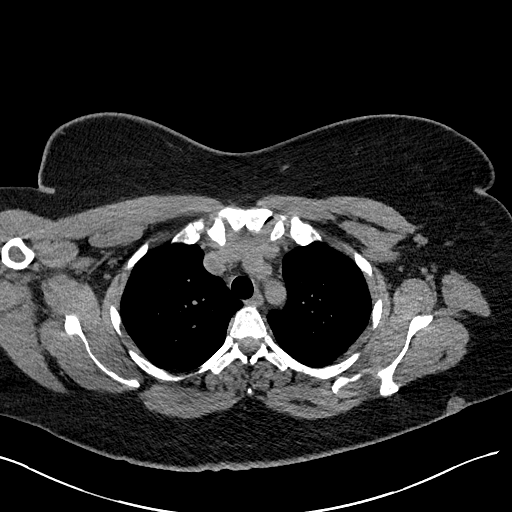
[im 102/131  lung]
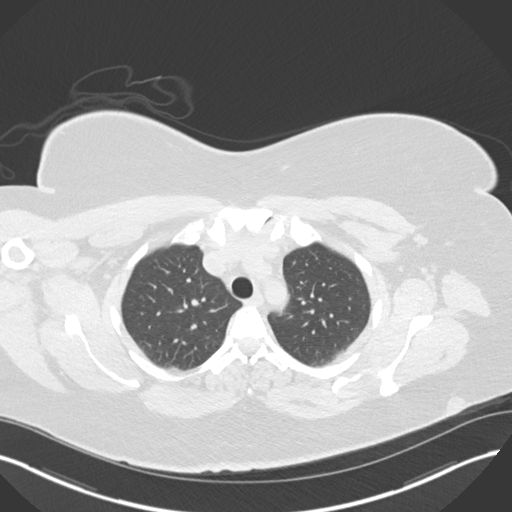
[im 111/131  lung]
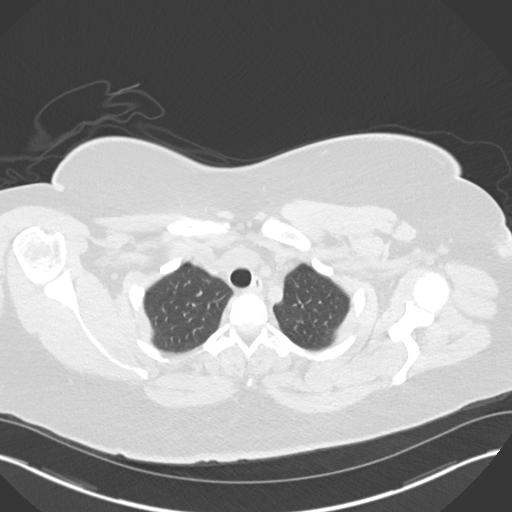
[im 121/131  lung]
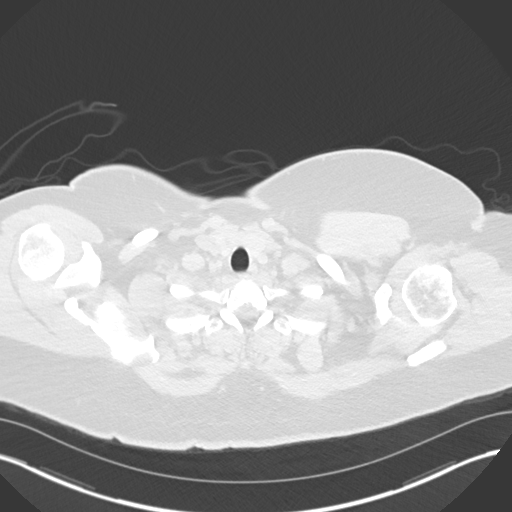

[Series 5: coronal · coronal · 0.52mm/px · 3 of 139 slices shown]
[im 28/139  lung]
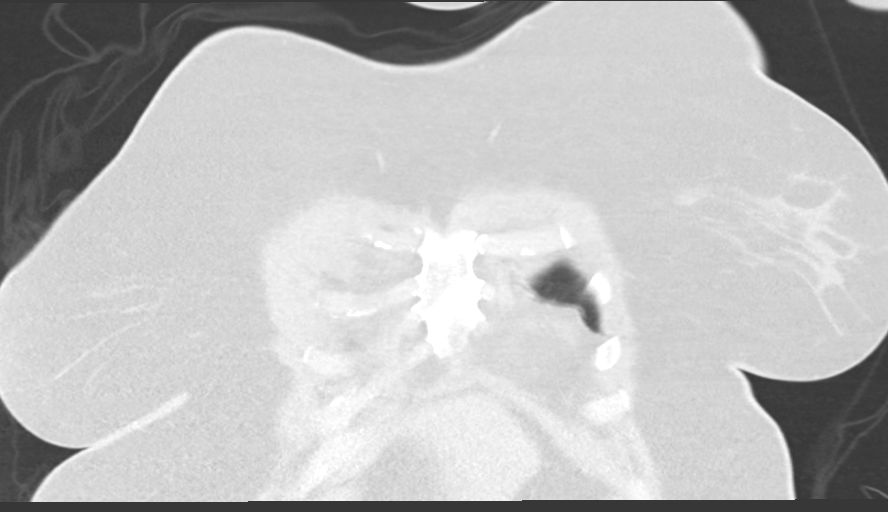
[im 56/139  lung]
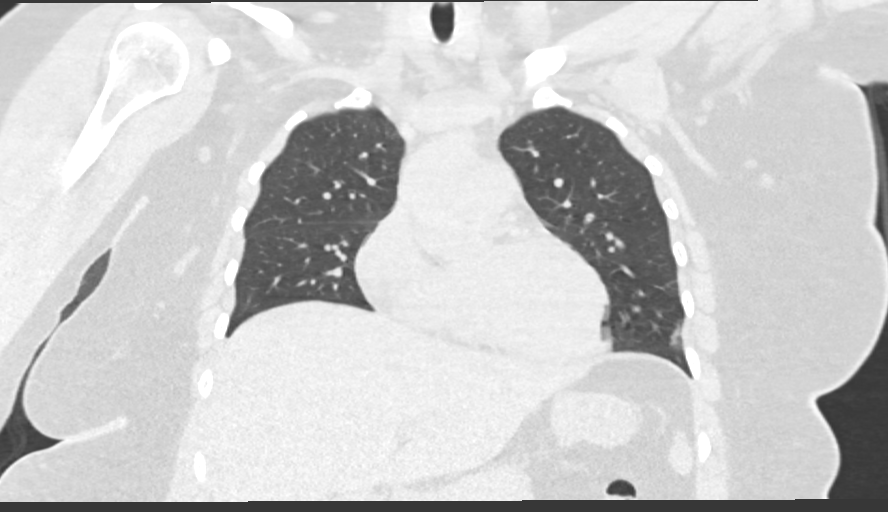
[im 83/139  lung]
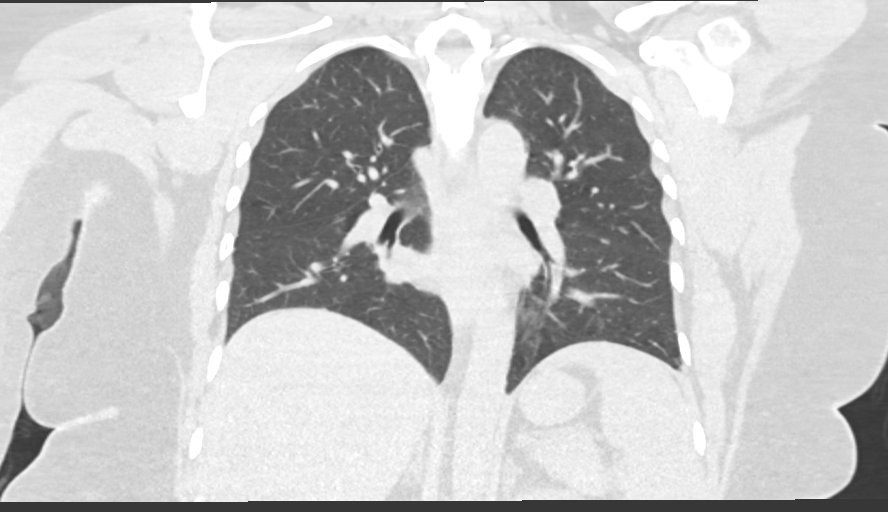

[14 of 36 positions shown; findings below may reference images not displayed]

FINDINGS: Cardiovascular: No significant vascular findings. Normal heart size.
No pericardial effusion.

Mediastinum/Nodes: No enlarged mediastinal, hilar, or axillary lymph
nodes. Thyroid gland, trachea, and esophagus demonstrate no
significant findings.

Lungs/Pleura: Lungs are clear. Trace bilateral pleural effusions
with associated atelectasis or consolidation.

Upper Abdomen: No acute abnormality.

Musculoskeletal: No chest wall mass or suspicious bone lesions
identified.
IMPRESSION: 1. Trace bilateral pleural effusions with associated atelectasis or
consolidation.
2. No displaced rib fracture or other non-contrast CT evidence of
acute traumatic injury to the chest.

## 2021-12-01 ENCOUNTER — Encounter: Payer: PRIVATE HEALTH INSURANCE | Attending: Physical Medicine & Rehabilitation | Primary: Family Medicine

## 2022-07-16 ENCOUNTER — Ambulatory Visit
Admit: 2022-07-16 | Discharge: 2022-07-27 | Payer: PRIVATE HEALTH INSURANCE | Attending: Physical Medicine & Rehabilitation | Primary: Family Medicine

## 2022-07-16 DIAGNOSIS — M5417 Radiculopathy, lumbosacral region: Secondary | ICD-10-CM

## 2022-07-16 MED ORDER — gabapentin (Neurontin) 300 mg capsule
300 | ORAL_CAPSULE | Freq: Three times a day (TID) | ORAL | 0 refills | Status: AC
Start: 2022-07-16 — End: 2022-09-14

## 2022-07-16 NOTE — Progress Notes (Signed)
[address]  [place], [place] [address]    [address]  [place], [place] [address]    Phone: [phone]  Fax:      [phone]  E-mail: [email or web address]         PATIENT NAME:  Linda Conley   PATIENT DOB:  04/29/1981   PCP:   Marlan Palau, MD   PROVIDER:  Debera Lat, MD   ENCOUNTER DATE: 07/16/2022    Chief complaint/History of Present Illness:  [name] is a pleasant 41 y.o. year old female who presents for a follow up evaluation of her Low Back radiating into the left leg, below the knee. Pt reports occasional numbness in the left foot.    Aubree rates her pain as 6/10 VAS at its worst.  The pain is described as aching and constant across lower back. There is a sharp, shooting pain down the left leg and can be exacerbated by nothing. constant pain while alleviated by injection.     Thus far, the patient has tried L3-4 Epidural Steroid Injection on 07/30/2021 with 100% relief.     The pain returned approximately 2 months ago while packing up her house.  While the pain does not have the same intensity as it did prior to her lumbar Interlaminar epidural steroid injection, it seriously affects her quality of life and limits her functional mobility.      She continues her workout since May and then had to stop secondary to the pain.  She takes tylenol without long term relief.  She has not recently taken gabapentin as she ran out of the medication.     History:  Past Medical History:   Diagnosis Date   . Anxiety    . Back pain    . Cancer      Past Surgical History:   Procedure Laterality Date   . BREAST SURGERY N/A    . COLON SURGERY       Family History   Problem Relation Name Age of Onset   . Lumbar disc disease Mother     . Lumbar disc disease Father        Social History     Socioeconomic History   . Marital status: Married     Spouse name: Not on file   . Number of children: Not on file   . Years of education: Not on file   . Highest education level: Not on file   Occupational  History   . Not on file   Tobacco Use   . Smoking status: Never   . Smokeless tobacco: Never   Substance and Sexual Activity   . Alcohol use: Not Currently   . Drug use: Never   . Sexual activity: Defer   Other Topics Concern   . Not on file   Social History Narrative   . Not on file     Social Determinants of Health     Financial Resource Strain: Not on file   Food Insecurity: Not on file   Transportation Needs: Not on file   Physical Activity: Not on file   Stress: Not on file   Social Connections: Not on file   Intimate Partner Violence: Not on file   Housing Stability: Not on file      Erythromycin    Medications:  Current Outpatient Medications   Medication Instructions   . albuterol 90 mcg/actuation inhaler ProAir HFA 90 mcg/actuation aerosol inhaler   . benzonatate (Tessalon)  200 mg capsule benzonatate 200 mg capsule   . clindamycin (Cleocin T) 1 % external solution Daily   . codeine-guaifenesin (Robitussin-AC) 10-100 mg/5 mL syrup 10 mL, Every 8 hours   . codeine-guaifenesin (Robitussin-AC) 10-100 mg/5 mL syrup 10 mL, 3 times daily PRN   . gabapentin (NEURONTIN) 300 mg, oral, 3 times daily   . ondansetron ODT (Zofran-ODT) 4 mg disintegrating tablet ondansetron 4 mg disintegrating tablet   . sertraline (ZOLOFT) 50 mg, oral, Daily   . traZODone (DESYREL) 150 mg, oral, Nightly PRN       Review of Systems:  Pertinent items are noted in HPI    PHYSICAL EXAMINATION:  GENERAL: well appearing, alert and oriented x3, appropriate mood/affect in no acute distress  CHEST: symmetric chest rise, no shortness of breath  HEENT: NC/AT  SKIN: No acute lesions affecting the face  CEREBELLAR: Coordination is grossly intact    Assessment/Plan:    Spinal anatomy using the patient's imaging as an example was reviewed.  Management including physical therapy, spinal injections, and surgery was discussed.  A stretching and exercise program was presented and encouraged.    Problem List Items Addressed This Visit           ICD-10-CM        Nervous    L-S radiculopathy - Primary M54.17    Overview     07/10/21: L3-4 Interlaminar epidural steroid injection with 100% relief  07/16/22: Exacerbation approximately 2 months prior while packing her house for a move.  Continued supervised home exercise program and oral medications without long-term benefit.  Return of functionally limiting pain.  Continued neurologic deficits. REC: repeat L3-4 Interlaminar epidural steroid injection, refill gabapentin         Current Assessment & Plan     Considering the patient's ongoing pain that has been refractory to conservative  management including physical therapy/supervised home exercise program and NSAIDs/prescribed oral medications for at least 6 weeks during the last 6 months and is appreciable in an examination that reveals neurologic deficits that parallel the patient's MRI findings, it is medically necessary to order: Repeat L3-4 interlaminar epidural steroid injection    Risks and benefits were explained.  All questions were answered.   The consent form will be signed on the procedure day.          Relevant Medications    gabapentin (Neurontin) 300 mg capsule    Other Relevant Orders    Lumbar Epidural Steroid Injection        Total time spent (excluding billable services): 30 minutes for this follow visit.      Preparing to see the patient.       [place] U review and reconciliation of medical/surgical history, medications, review of systems,       Time the provider spends taking a patient's history.      Performing a medically appropriate exam and/or evaluation.      Counseling and educating the patient/family/caregiver.      Ordering medications, tests, or procedures.      Referring and communicating with other healthcare professionals.      Time spent documenting clinical information.      Independent interpretation of results and communicating results to the patient/family/caregiver and care coordination.

## 2022-07-27 DIAGNOSIS — M5417 Radiculopathy, lumbosacral region: Secondary | ICD-10-CM

## 2022-07-27 NOTE — Assessment & Plan Note (Signed)
Considering the patient's ongoing pain that has been refractory to conservative  management including physical therapy/supervised home exercise program and NSAIDs/prescribed oral medications for at least 6 weeks during the last 6 months and is appreciable in an examination that reveals neurologic deficits that parallel the patient's MRI findings, it is medically necessary to order: Repeat L3-4 interlaminar epidural steroid injection    Risks and benefits were explained.  All questions were answered.   The consent form will be signed on the procedure day.

## 2022-07-29 NOTE — Telephone Encounter (Signed)
Talked to PT advised her to talk to Cintya who get the approvals for  the injections

## 2022-07-29 NOTE — Telephone Encounter (Signed)
Hi Linda Conley,  This pt is waiting for your call to have an appt for cort inj for her back. thank you!

## 2022-07-29 NOTE — Telephone Encounter (Signed)
Hi Linda Conley,  This pt is waiting for your call to have an appt for cort inj for her back.  To add this pt would like to inform you that she is moving in [place] in 2 weeks dec 20 thats why she need an appt for her back thank you!

## 2022-08-04 NOTE — Telephone Encounter (Signed)
Good day Shanda Bumps,        Patient called in, request for a callback from you about her upcoming injection appointment. She refused to provide additional information [place]. Callback: [phone]        Thank you,  Zenon Mayo

## 2022-08-06 ENCOUNTER — Ambulatory Visit
Admit: 2022-08-06 | Discharge: 2022-08-06 | Payer: PRIVATE HEALTH INSURANCE | Attending: Physical Medicine & Rehabilitation | Primary: Family Medicine

## 2022-08-06 DIAGNOSIS — M5417 Radiculopathy, lumbosacral region: Secondary | ICD-10-CM

## 2022-08-06 NOTE — Progress Notes (Signed)
[address]  [place], [place] [address]    [address]  [place], [place] [address]    Phone: [phone]  Fax:      [phone]  E-mail: [email or web address]        PATIENT NAME: Linda Conley   PATIENT DOB:  Jan 11, 1981   PCP:   Marlan Palau, MD  PROCEDURALIST: Debera Lat, MD  PROCEDURE DATE: 08/06/2022    Vitals:    08/06/22 1459   BP: (!) 148/92   BP Location: Right arm   Patient Position: Sitting   BP Cuff Size: Adult   Weight: 122.5 kg   Height: 1.651 m        PROCEDURE NOTE  Lumbar Epidural Steroid Injection    Date/Time: 08/06/2022 3:22 PM    Performed by: Jerilee Field, MD  Authorized by: Jerilee Field, MD    Procedure details:   FLUOROSCOPICALLY GUIDED LUMBAR L3-4 INTERLAMINAR EPIDURAL STEROID INJECTION  The procedure was explained. Risks, benefits and alternatives were explained. A consent form was signed. The patient was placed prone on the exam table. The area overlying the lumbosacral spine was exposed and prepared using a chlorhexidine swabs. This area was then allowed to dry and draped. The entry point overlying the L3-4 interlaminar area was identified using both anatomic landmarks and fluoroscopy. A skin wheal was raised at the entry point location using preservative free 1% lidocaine without epinephrine delivered via a 1.5" 2g skin needle. The needle was then inserted and directed toward the interlaminar area. As the needle was withdrawn from each location, 1 mL of preservative free 1% lidocaine without epinephrine was injected. A 20g " Tuohy was then inserted and directed toward the interlaminar area using intermittent fluoroscopy in AP, lateral, and 50 degree contralateral oblique views. The stylus was removed and a loss of resistance syringe was connected to the needle. Then, using the loss of resistance technique to saline the needle was advanced to the epidural space. The location was confirmed through injection of Omnipaque 240 mg/mL AP, lateral, and 50 degree  contralateral oblique views were saved to the PACS, as appropriate. Then, 2 mL of normal saline mixed with 1 mL of 40 mg/mL methylprednisolone was injected into the epidural space. The needle was then withdrawn. The skin was cleansed and a sterile adhesive bandage was placed overlying the entry site. All aspirates were negative. At the conclusion of the procedure, Selicia had a mild vasovagal response, feeling of dizziness/lightheadedness. [name] was placed into a supine reverse Trendelenberg position.  Ice packs were applied to the forehead and lower back.  The sensation rapidly resolved and the patient ambulated to the recovery area without difficulty, where she was monitored.    Recovery and Discharge: After the procedure was completed, [name] was transferred to the recovery area and observed and monitored by our recovery staff. The discharge instructions and post procedural pain logs were reviewed.  Red flag findings were discussed with the patient as a reason for emergent evaluation.      Complications:  Mild post procedural vasovagal response without LOC.    Follow-up:  14-28 days post procedure.       During the course of the procedure, multiple extra images were taken for each level to localize needles to the correct position secondary to the patient's body habitus/or advanced spondylosis.  The radiology technologist was utilized to maneuver the fluoroscopy a greater degree to better visualize the location of the procedure end point.  A substantially longer  needle was used, considering the patient's body habitus.

## 2022-08-10 NOTE — Telephone Encounter (Signed)
Hi Dr. Kapasi , Can you take a look at my notes? Recommendations?

## 2022-08-10 NOTE — Telephone Encounter (Signed)
Good Day Linda Conley,    Patient called in would like to speak with you to discuss about the injection. She didn't provide any further informations. Callback # [phone]    Thank you,    Leda MinJera

## 2022-08-10 NOTE — Telephone Encounter (Signed)
Spoke with Linda MinkKacie Marie Conley she states that she had an injection with Dr. Jonathon BellowsKapasi on 08/06/22 for LUMBAR L3-4 INTERLAMINAR EPIDURAL STEROID INJECTION    She states that she still has not felt any relief yet. She states the pain is the same as before. She is concerned because she is moving to [place] in 2 weeks    I told her that this is normal because it can take up to 2 weeks for the injections to start working.     She is currently taking gabapentin 900 mg TID and Tylenol 1000 mg TID with no relief.     told her I will send the message to Dr. Jonathon BellowsKapasi for further recommendations and call back. I told her that he may want to continue to monitor or since she will be leaving prescribing lidocaine patches. She agreed.

## 2022-08-11 NOTE — Telephone Encounter (Signed)
Hi Linda Conley,    Patient wants to book follow up injection appointment. Callback number is [phone].    Thank you.    Nam

## 2022-08-13 MED ORDER — predniSONE (Deltasone) 20 mg tablet
20 | ORAL_TABLET | ORAL | 0 refills | Status: DC
Start: 2022-08-13 — End: 2022-08-19

## 2022-08-13 NOTE — Telephone Encounter (Signed)
.    Considering the progression in her pain and neurologic deficits, along with the mom relief secondary to her spinal injection, it is medically necessary to order a repeat MRI of the lumbosacral spine for further evaluation.  This will aid in further interventional versus surgical planning.

## 2022-08-13 NOTE — Telephone Encounter (Signed)
Spoke with Linda MinkKacie Marie Conley and gave recommendations per Dr. Jonathon BellowsKapasi. She agreed.

## 2022-08-19 ENCOUNTER — Ambulatory Visit
Admit: 2022-08-19 | Discharge: 2022-08-19 | Payer: PRIVATE HEALTH INSURANCE | Attending: Physical Medicine & Rehabilitation | Primary: Family Medicine

## 2022-08-19 DIAGNOSIS — M5417 Radiculopathy, lumbosacral region: Secondary | ICD-10-CM

## 2022-08-19 MED ORDER — predniSONE (Deltasone) 20 mg tablet
20 | ORAL_TABLET | ORAL | 0 refills | Status: AC
Start: 2022-08-19 — End: 2022-08-24

## 2022-08-19 NOTE — Progress Notes (Signed)
[address]  [place], [place] [address]    [address]  [place], [place] [address]    Phone: [phone]  Fax:      [phone]  E-mail: [email or web address]         PATIENT NAME:  Linda Conley   PATIENT DOB:  Aug 24, 1981   PCP:   Linda Palau, MD   PROVIDER:  Debera Lat, MD   ENCOUNTER DATE: 08/19/2022    Chief complaint/History of Present Illness:  Linda Conley is a pleasant 41 y.o. year old female who presents for a follow up evaluation of her Low Back confined to the lower back, without radiation.    Linda Conley rates her pain as 9/10 VAS at its worst.  The pain is described as uncomfortable and pressure and can be exacerbated by bending sideways and laying on left side while alleviated by laying down.     Since her last visit, Linda Conley had an L3-4 interlaminar epidural steroid injection on 08/06/22.  She felt 90% relief from this; however.       History:  Past Medical History:   Diagnosis Date   . Anxiety    . Back pain    . Cancer      Past Surgical History:   Procedure Laterality Date   . BREAST SURGERY N/A    . COLON SURGERY       Family History   Problem Relation Name Age of Onset   . Lumbar disc disease Mother     . Lumbar disc disease Father        Social History     Socioeconomic History   . Marital status: Married     Spouse name: Not on file   . Number of children: Not on file   . Years of education: Not on file   . Highest education level: Not on file   Occupational History   . Not on file   Tobacco Use   . Smoking status: Never   . Smokeless tobacco: Never   Substance and Sexual Activity   . Alcohol use: Not Currently   . Drug use: Never   . Sexual activity: Defer   Other Topics Concern   . Not on file   Social History Narrative   . Not on file     Social Determinants of Health     Financial Resource Strain: Not on file   Food Insecurity: Not on file   Transportation Needs: Not on file   Physical Activity: Not on file   Stress: Not on file   Social Connections: Not on file   Intimate  Partner Violence: Not on file   Housing Stability: Not on file      Erythromycin    Medications:  Current Outpatient Medications   Medication Instructions   . albuterol 90 mcg/actuation inhaler ProAir HFA 90 mcg/actuation aerosol inhaler   . benzonatate (Tessalon) 200 mg capsule benzonatate 200 mg capsule   . clindamycin (Cleocin T) 1 % external solution Daily   . codeine-guaifenesin (Robitussin-AC) 10-100 mg/5 mL syrup 10 mL, Every 8 hours   . codeine-guaifenesin (Robitussin-AC) 10-100 mg/5 mL syrup 10 mL, 3 times daily PRN   . gabapentin (NEURONTIN) 300 mg, oral, 3 times daily   . ondansetron ODT (Zofran-ODT) 4 mg disintegrating tablet ondansetron 4 mg disintegrating tablet   . predniSONE (Deltasone) 20 mg tablet Take 3 tablets (60 mg) by mouth with breakfast for 1 day, THEN 3 tablets (60 mg) with  breakfast for 1 day, THEN 2 tablets (40 mg) with breakfast for 1 day, THEN 2 tablets (40 mg) with breakfast for 1 day, THEN 1 tablet (20 mg) with breakfast for 1 day, THEN 1 tablet (20 mg) with breakfast for 1 day.   . sertraline (ZOLOFT) 50 mg, oral, Daily   . traZODone (DESYREL) 150 mg, oral, Nightly PRN       Review of Systems:  Pertinent items are noted in HPI    PHYSICAL EXAMINATION:  GENERAL: well appearing, alert and oriented x3, appropriate mood/affect in no acute distress  CHEST: symmetric chest rise, no shortness of breath  HEENT: NC/AT  SKIN: No acute lesions affecting the face  CEREBELLAR: Coordination is grossly intact    Assessment/Plan:    Spinal anatomy using the patient's imaging as an example was reviewed.  Management including physical therapy, spinal injections, and surgery was discussed.  A stretching and exercise program was presented and encouraged.    Problem List Items Addressed This Visit           ICD-10-CM       Nervous    L-S radiculopathy - Primary M54.17    Overview     12/30/20:  MRI LS spine performed at [facility].  no acute fracture or listhesis.  No loss of vertebral body height.  Disc  space narrowing in the L4-5 disc.  Mild to moderate endplate degenerative changes. Conus medullaris ends at L1-2.   T12-L3: unremarkable  L3-4: minimal disc bulge and mild bilateral facet joint degenerative changes without central spinal stenosis  L4-5: moderate central spinal stenosis secondary to a broad-based right eccentric posterior disc extrusion and mild bilateral facet joint degenerative changes.  There is severe right subarticular recess stenosis with compression of the traversing right L5 nerve root.  Disc extrusion measures 0.7x1.3x1.3cm.  L5-S1: mild broad-based posterior central disc protrusion and mild bilateral facet joint degenerative changes without central spinal stenosis  01/16/2021: L3-4 interlaminar epidural steroid injection with 100% relief of symptoms in her thigh but she is still experiencing pain in her low back and lower leg  02/26/2021: L3-4 interlaminar epidural steroid injection with 100% relief through mid August  07/10/2021: The patient had >60% relief from the previous injection.  The patient had improved functional mobility during the post procedural injection relief period and was able to decrease oral medication intake.  The patient strictly adhered to the previously provided supervised home exercise program, since the last injection without long term benefit.  Hence, it is medically necessary to order: #3 L3-4 interlaminar epidural steroid injection.  07/10/21: L3-4 Interlaminar epidural steroid injection with 100% relief  07/16/22: Exacerbation approximately 2 months prior while packing her house for a move.  Continued supervised home exercise program and oral medications without long-term benefit.  Return of functionally limiting pain.  Continued neurologic deficits. REC: repeat L3-4 Interlaminar epidural steroid injection, refill gabapentin  08/08/22: L3-4 interlaminar epidural steroid injection with 90% relief.    08/19/22: relief is ongoing, but now feels a bowling ball like  heaviness in the LEFT low back.  Ongoing LEFT sided radiating pain  Issabelle is traveling to [place] this week.  She will be unavailable for any further management currently.  Hence, I have printed out a list of spine providers in [place] that she can further research.  I also asked her to bring her MRI imaging from May 2022 with her to [place].  Additionally, I have given her an oral steroid taper to help her with the  residual pain.  She will follow-up with a provider in [place] at her convenience.         Relevant Medications    predniSONE (Deltasone) 20 mg tablet       Other    Abnormal MRI, lumbar spine R93.7    Overview     01/17/21: MR LS spine, [name]  Bone: There is no spondylolisthesis. There is no loss of vertebral body  height. There is disc space narrowing with partial loss of T2 signal intensity in the L4-5 disc. There is mild to moderate  endplate degenerative change with only very mild marrow edema.  T12-L1: Unremarkable.  L1-2: Unremarkable.  L2-3: Unremarkable.  L3-4: There is a minimal disc bulge and mild bilateral facet joint  degenerative change without central spinal stenosis.  L4-5: There is moderate central spinal stenosis secondary to a broad-based  right eccentric posterior disc extrusion and mild bilateral facet joint degenerative change. There is severe right subarticular  recess stenosis with likely compression of the traversing right L5 nerve root. The disc extrusion measures 0.7 AP x 1.3 LR  x 1.3 SI cm.  L5-S1: There is a mild broad-based posterior central disc protrusion and  mild bilateral facet joint degenerative change without central spinal stenosis.             Total time spent (excluding billable services): 30 minutes for this follow visit.      Preparing to see the patient.       Farley review and reconciliation of medical/surgical history, medications, review of systems,       Time the provider spends taking a patient's history.      Performing a medically appropriate exam and/or  evaluation.      Counseling and educating the patient/family/caregiver.      Ordering medications, tests, or procedures.      Referring and communicating with other healthcare professionals.      Time spent documenting clinical information.      Independent interpretation of results and communicating results to the patient/family/caregiver and care coordination.

## 2022-08-20 ENCOUNTER — Encounter: Payer: PRIVATE HEALTH INSURANCE | Attending: Physical Medicine & Rehabilitation | Primary: Family Medicine

## 2023-05-26 NOTE — Telephone Encounter (Signed)
Hi Linda Conley,    PT called in want to book an appointment for the injection.     Best call back number [phone].    Thank you,  Vonna Kotyk

## 2023-06-07 ENCOUNTER — Ambulatory Visit
Admit: 2023-06-07 | Discharge: 2023-06-07 | Payer: PRIVATE HEALTH INSURANCE | Attending: Physical Medicine & Rehabilitation | Primary: Family Medicine

## 2023-06-07 DIAGNOSIS — M5451 Vertebrogenic low back pain: Secondary | ICD-10-CM

## 2023-06-07 NOTE — Progress Notes (Signed)
[address]  [place], [place] [address]    [address]  [place], [place] [address]    Phone: [phone]  Fax:      [phone]  E-mail: [email or web address]         PATIENT NAME:  Linda Conley   PATIENT DOB:  04-01-1981   PCP:   Marlan Palau, MD   PROVIDER:  Debera Lat, MD   ENCOUNTER DATE: 06/07/2023  OTHER PARTICIPANT: none    Chief complaint/History of Present Illness:  Linda Conley is a pleasant 42 y.o. year old female who presents for a follow up evaluation of her Low Back pain without radiation.     Linda Conley rates her pain as 9/10 VAS at its worst.  The pain is described as uncomfortable and pressure and can be exacerbated by bending sideways and laying on left side while alleviated by laying down.     Linda Conley was last seen on 08/06/22 and had a L3-4 interlaminar epidural steroid injection with 90% relief for 7 months. The pain has slowly started to return over the past month. She feels the pain is exacerbated with forward flexion.     I personally reviewed Linda Conley's last MR of the LS spine that is significant for a right paracentral disc herniation/extrusion at the L4-5 level.  There are also type Type II modic endplate changes at the L4-5 level.    History:  Past Medical History:   Diagnosis Date    Anxiety     Back pain     Cancer (Multi-HCC)      Past Surgical History:   Procedure Laterality Date    BREAST SURGERY N/A     COLON SURGERY       Family History   Problem Relation Name Age of Onset    Lumbar disc disease Mother      Lumbar disc disease Father        Social History     Socioeconomic History    Marital status: Married     Spouse name: None    Number of children: None    Years of education: None    Highest education level: None   Occupational History    None   Tobacco Use    Smoking status: Never    Smokeless tobacco: Never   Substance and Sexual Activity    Alcohol use: Not Currently    Drug use: Never    Sexual activity: Defer   Other Topics Concern    None   Social History  Narrative    None     Social Determinants of Health     Financial Resource Strain: Not on file   Food Insecurity: Not on file   Transportation Needs: Not on file   Physical Activity: Not on file   Stress: Not on file   Social Connections: Not on file   Intimate Partner Violence: Not on file   Housing Stability: Not on file      Linda Conley    Medications:  Current Outpatient Medications   Medication Instructions    albuterol 90 mcg/actuation inhaler ProAir HFA 90 mcg/actuation aerosol inhaler    benzonatate (Tessalon) 200 mg capsule benzonatate 200 mg capsule    buPROPion XL (WELLBUTRIN XL) 300 mg, oral, Daily    clindamycin (Cleocin T) 1 % external solution Daily    Clomid 50 mg tablet oral    codeine-guaifenesin (Robitussin-AC) 10-100 mg/5 mL syrup 10 mL, Every 8 hours    codeine-guaifenesin (  Robitussin-AC) 10-100 mg/5 mL syrup 10 mL, 3 times daily PRN    gabapentin (NEURONTIN) 300 mg, oral, 3 times daily    ondansetron ODT (Zofran-ODT) 4 mg disintegrating tablet ondansetron 4 mg disintegrating tablet    sertraline (ZOLOFT) 50 mg, oral, Daily    traZODone (DESYREL) 150 mg, oral, Nightly PRN       Review of Systems:  Pertinent items are noted in HPI    PHYSICAL EXAMINATION:  GENERAL: well appearing, alert and oriented x3, appropriate mood/affect in no acute distress  CVS: peripheral pulses are intact and symmetric  CHEST: symmetric chest rise, no shortness of breath  HEENT: NC/AT  SKIN: No acute lesions affecting the face, trunk or upper/lower extremities  FEET/ANKLES: no cyanosis, clubbing, edema  HEAD/NECK, SPINE/RIBS/PELVIS, UPPER LIMBS: no misalignment/instability. ROM and upper extremity muscle strength are grossly intact  GAIT/STATION: normal  SPINE: The pelvis is level.  Supine straight leg raise are negative bilaterally.  SPINE RANGE OF MOTION: There is diminished range of motion in lumbar flexion, extension, lateral flexion, and rotation with increased pain in  forward flexion.  MUSCLE STRENGTH: 5/5  bilaterally in the hip flexors, hip abductors, hip adductors, dorsi and plantar flexors.  The LEFT quadriceps raate 4+/5 while the right rate 5/5.  SENSATION: Equal and symmetric bilaterally in the L3-S1 dermatomes to pinprick sensation.  MUSCLE TENDON REFLEXES: Equal and symmetric in the quadriceps and Achilles.  No sustained clonus could be elicited either ankle.   CEREBELLAR: Coordination is grossly intact    Assessment/Plan:    Spinal anatomy using the patient's imaging as an example was reviewed.  Management including physical therapy, spinal injections, and surgery was discussed.  A stretching and exercise program was presented and encouraged.    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  06/07/23  Debera Lat, MD, Physical Medicine & Rehabilitation:   6 month history of nonradiating low back pain when bending forward.  Previously learned PT exercises were unsuccessful. EXAM; pain with forward lumbar flexion.  REC; new MRI to further qualify endplate changes and consider bvn ablation         Relevant Orders    MR LUMBAR SPINE WO CONTRAST       Other    Abnormal MRI, lumbar spine R93.7    Overview     01/17/21: MR LS spine, Linda Conley  06/07/23 Debera Lat, MD addendum: Type II Modic changes in the superior endplate of L5 and inferior endplate of L4.  Bone: There is no spondylolisthesis. There is no loss of vertebral body  height. There is disc space narrowing with partial loss of T2 signal intensity in the L4-5 disc. There is mild to moderate  endplate degenerative change with only very mild marrow edema.  T12-L1: Unremarkable.  L1-2: Unremarkable.  L2-3: Unremarkable.  L3-4: There is a minimal disc bulge and mild bilateral facet joint  degenerative change without central spinal  stenosis.  L4-5: There is moderate central spinal stenosis secondary to a broad-based  right eccentric posterior disc extrusion and mild bilateral facet joint degenerative change. There is severe right subarticular  recess stenosis with likely compression of the traversing right L5 nerve root. The disc  extrusion measures 0.7 AP x 1.3 LR  x 1.3 SI cm.  L5-S1: There is a mild broad-based posterior central disc protrusion and  mild bilateral facet joint degenerative change without central spinal stenosis.             Total time spent (excluding billable services): 30 minutes for this follow visit.  Abdomen:      Preparing to see the patient.       [place] review and reconciliation of medical/surgical history, medications, review of systems,       Time the provider spends taking a patient's history.      Performing a medically appropriate exam and/or evaluation.      Counseling and educating the patient/family/caregiver.      Ordering medications, tests, or procedures.      Referring and communicating with other healthcare professionals.      Time spent documenting clinical information.      Independent interpretation of results and communicating results to the patient/family/caregiver and care coordination       Debera Lat, MD, [facility]  Physical Medicine & Rehabilitation, [facility]  Assistant Clinical Professor, [facility]

## 2023-06-28 NOTE — Telephone Encounter (Signed)
Hi Linda Conley,    Pt called in and she wants to speak with medical secretary regarding to her concern but she don't want to open since she mentioned that its private.    Phone :[phone]     Thank you,  Radj

## 2023-07-01 NOTE — Telephone Encounter (Signed)
Left voicemail for patient to schedule MRI follow up for the Lumber Spine with Dr.Kapasi.     Exam date is on 11.13.2024.     Told patient to call back to schedule appointment.

## 2023-07-19 MED ORDER — predniSONE (Deltasone) 20 mg tablet
20 | ORAL_TABLET | ORAL | 0 refills | Status: AC
Start: 2023-07-19 — End: 2023-07-24

## 2023-07-19 NOTE — Telephone Encounter (Signed)
Spoke with Linda Conley and she is feeling a 10/10 pain. She is coming in this Wednesday for an MRI review- possible intracept.     Trialed ibuprofen, tylenol, gaba, etc. She requested oxycodone, told her Dr. Jonathon Bellows does not prescribe opioids. She has previously tried oral steroids and this was somewhat helpful, she would like to try it again. No hyperglycemia or HTN

## 2023-07-19 NOTE — Telephone Encounter (Signed)
Hi Team,    Patient called in today requesting a callbck from you. She is seeking a medical advice because of ther pain she is feeling right now. Can you help me check this patient? This is her call back: [phone]. She would appreciate your callback as soon as possible.    Thanks,  Aly

## 2023-07-21 ENCOUNTER — Ambulatory Visit
Admit: 2023-07-21 | Discharge: 2023-07-21 | Payer: PRIVATE HEALTH INSURANCE | Attending: Physical Medicine & Rehabilitation | Primary: Family Medicine

## 2023-07-21 DIAGNOSIS — R937 Abnormal findings on diagnostic imaging of other parts of musculoskeletal system: Secondary | ICD-10-CM

## 2023-07-21 DIAGNOSIS — M5451 Vertebrogenic low back pain: Secondary | ICD-10-CM

## 2023-07-21 NOTE — Progress Notes (Signed)
[address]  [place], [place] [address]    [address]  [place], [place] [address]    Phone: [phone]  Fax:      [phone]  E-mail: [email or web address]         PATIENT NAME:  Linda Conley   PATIENT DOB:  04-10-1981   PCP:   Marlan Palau, MD   PROVIDER:  Debera Lat, MD   ENCOUNTER DATE: 07/21/2023  OTHER PARTICIPANT: none    Chief complaint/History of Present Illness:  Linda Conley is a pleasant 42 y.o. year old female who presents for a follow up evaluation of her Low Back pain without radiation.     Linda Conley rates her pain as 9/10 VAS at its worst.  The pain is described as uncomfortable and pressure and can be exacerbated by bending sideways and laying on left side while alleviated by laying down.      Linda Conley was last seen on 08/06/22 and had a L3-4 interlaminar epidural steroid injection with 90% relief for 7 months. The pain has slowly started to return over the past month. She feels the pain is exacerbated with forward flexion.     She was recently prescribed a course of oral steroids and started this yesterday. She feels significant (almost 100%) relief from this.   She is concerned the pain may return after the oral steroid taper finishes.    Linda Conley had an MRI of the lumbosacral spine at [facility] on 07/14/23 and presents today to review this.  I personally reviewed and interpreted these images with the [facility] today,  While there is disc material in a position to irritate the LEFT L5 nerve root,  there also obvious type I modic changes seen at the L4-5 level and milder type II modic changes at the L5-S1 levels.  Approach measured at 13.5cm on the RIGHT L4 level, 13.92cm at the RIGHT L5 level, 13.39cm at the RIGHT S1 level.         History:  Past Medical History:   Diagnosis Date    Anxiety     Back pain     Cancer (Multi-HCC)      Past Surgical History:   Procedure Laterality Date    BREAST SURGERY N/A     COLON SURGERY       Family History   Problem Relation Name Age of Onset     Lumbar disc disease Mother      Lumbar disc disease Father        Social History     Socioeconomic History    Marital status: Married     Spouse name: None    Number of children: None    Years of education: None    Highest education level: None   Occupational History    None   Tobacco Use    Smoking status: Never    Smokeless tobacco: Never   Substance and Sexual Activity    Alcohol use: Not Currently    Drug use: Never    Sexual activity: Defer   Other Topics Concern    None   Social History Narrative    None     Social Determinants of Health     Financial Resource Strain: Not on file   Food Insecurity: Not on file   Transportation Needs: Not on file   Physical Activity: Not on file   Stress: Not on file   Social Connections: Not on file   Intimate Partner Violence: Not on  file   Housing Stability: Not on file      Erythromycin    Medications:  Current Outpatient Medications   Medication Instructions    albuterol 90 mcg/actuation inhaler ProAir HFA 90 mcg/actuation aerosol inhaler    benzonatate (Tessalon) 200 mg capsule benzonatate 200 mg capsule    buPROPion XL (WELLBUTRIN XL) 300 mg, oral, Daily    clindamycin (Cleocin T) 1 % external solution Daily    Clomid 50 mg tablet oral    codeine-guaifenesin (Robitussin-AC) 10-100 mg/5 mL syrup 10 mL, Every 8 hours    codeine-guaifenesin (Robitussin-AC) 10-100 mg/5 mL syrup 10 mL, 3 times daily PRN    gabapentin (NEURONTIN) 300 mg, oral, 3 times daily    ondansetron ODT (Zofran-ODT) 4 mg disintegrating tablet ondansetron 4 mg disintegrating tablet    predniSONE (Deltasone) 20 mg tablet Take 3 tablets (60 mg) by mouth with breakfast for 1 day, THEN 3 tablets (60 mg) with breakfast for 1 day, THEN 2 tablets (40 mg) with breakfast for 1 day, THEN 2 tablets (40 mg) with breakfast for 1 day, THEN 1 tablet (20 mg) with breakfast for 1 day, THEN 1 tablet (20 mg) with breakfast for 1 day.    sertraline (ZOLOFT) 50 mg, oral, Daily    traZODone (DESYREL) 150 mg, oral, Nightly PRN        Review of Systems:  Pertinent items are noted in HPI    PHYSICAL EXAMINATION:  GENERAL: well appearing, alert and oriented x3, appropriate mood/affect in no acute distress  CHEST: symmetric chest rise, no shortness of breath  HEENT: NC/AT  SKIN: No acute lesions affecting the face  CEREBELLAR: Coordination is grossly intact  LUMBAR: non radiating low back pain exacerbated by forward flexion    Assessment/Plan:    Spinal anatomy using the patient's imaging as an example was reviewed.  Management including physical therapy, spinal injections, and surgery was discussed.  A stretching and exercise program was presented and encouraged.    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  06/07/23  Debera Lat, MD, Physical Medicine & Rehabilitation:   6 month history of nonradiating low back pain when bending forward.  Previously learned PT exercises were unsuccessful. EXAM; pain with forward lumbar flexion.  REC; new MRI to further qualify endplate changes and consider bvn ablation  07/21/23  Debera Lat, MD, Physical Medicine & Rehabilitation:  significant relief from oral steroid with returning low back pain.  Reviewed/interpreted MR LS spine.  EXAM: pain with forward flexion REC: L4-S1  basivertebral ablation         Current Assessment & Plan     Linda Conley has low back pain secondary to endplate changes and seen on recent MRI imaging that show Modic changes at the respective levels of the planned ablation.  The patient has difficulty with forward flexion and/or sitting.  Conservative management including at least six weeks of a physical therapy program, supervised home exercise program as recommended by a healthcare provider, and oral medications.  The patient has continued the prescribed  exercises since, without any significant long term relief.  The patient's pain continues to functionally limiting.  Hence, a basivertebral nerve radiofrequency ablation is  medically necessary to reduce the vertebrogenic pain secondary the endplate change.          Relevant Orders    AMBULATORY IN-OFFICE PROCEDURE       Other    Abnormal MRI, lumbar spine R93.7    Overview     07/14/23: MRI LS spine, Linda Conley  FINDINGS:  -   Five non-rib-bearing lumbar type vertebra.   -   Bones: No acute fracture.  Degenerative fatty marrow changes seen   along the L4-L5 endplates and anterior L5-S1 endplates.  No   suspicious marrow replacing lesion or focal bone marrow edema is   identified.  The visualized vertebral body heights are preserved.   -   Alignment: The usual lumbar lordosis is preserved. There is unchanged   grade 1 retrolisthesis of L5 over S1.   -   Intervertebral discs/Joints: There is loss of disc signal and   intervertebral disc space height at the L4-L5 level with associated   disc bulging and herniation.  Additional significant findings by   level described below.   -   Thecal sac: The conus medullaris terminates at the L1-L2 level.  The   visualized distal spinal cord and cauda equina nerve roots are   unremarkable.   -   Paravertebral soft tissue: Unremarkable.   -   T12-L1: No significant posterior disc abnormality, spinal canal or   neural foraminal stenosis.   -   L1-L2: No significant posterior disc abnormality, spinal canal or   neural foraminal stenosis.   -   L2-L3: No significant posterior disc abnormality, spinal canal or   neural foraminal stenosis.   -   L3-L4: No significant posterior disc abnormality, spinal canal or   neural foraminal stenosis.   -   L4-L5: There is left asymmetric disc bulging with a superimposed   small left paracentral/subarticular disc protrusion, bilateral facet   hypertrophy and ligamentum flavum thickening resulting in mild spinal   canal stenosis, moderate left and mild  right subarticular zone   stenoses and mild left and minimal right neural foraminal stenoses   with possible impingement on the descending left L5 nerve roots in   the discal left subarticular zone.  The previously noted right   paracentral/subarticular disc protrusion at this level is no longer   visualized.   -   L5-S1: No significant posterior disc abnormality, spinal canal or   neural foraminal stenosis.     01/17/21: MR LS spine, Linda Conley  06/07/23 Debera Lat, MD addendum: Type II Modic changes in the superior endplate of L5 and inferior endplate of L4.  Bone: There is no spondylolisthesis. There is no loss of vertebral body  height. There is disc space narrowing with partial loss of T2 signal intensity in the L4-5 disc. There is mild to moderate  endplate degenerative change with only very mild marrow edema.  T12-L1: Unremarkable.  L1-2: Unremarkable.  L2-3: Unremarkable.  L3-4: There is a minimal disc bulge and mild bilateral facet joint  degenerative change without central spinal stenosis.  L4-5: There is moderate central spinal stenosis secondary to a broad-based  right eccentric posterior disc extrusion and mild bilateral facet joint degenerative change. There is severe right subarticular  recess stenosis with likely compression of the traversing right L5 nerve root. The disc extrusion measures 0.7 AP x 1.3 LR  x 1.3 SI cm.  L5-S1: There is a mild broad-based posterior central disc protrusion and  mild bilateral facet joint degenerative change without  central spinal stenosis.             Total time spent (excluding billable services): 30 minutes for this follow visit.      Preparing to see the patient.       Challis review and reconciliation of medical/surgical history, medications, review of systems,       Time the provider spends taking a patient's history.      Performing a medically appropriate exam and/or evaluation.      Counseling and educating the patient/family/caregiver.      Ordering medications,  tests, or procedures.      Referring and communicating with other healthcare professionals.      Time spent documenting clinical information.      Independent interpretation of results and communicating results to the patient/family/caregiver and care coordination       Debera Lat, MD, [facility]  Physical Medicine & Rehabilitation, [facility]  Assistant Clinical Professor, [facility] of Medicine

## 2023-07-21 NOTE — Assessment & Plan Note (Signed)
Linda Conley has low back pain secondary to endplate changes and seen on recent MRI imaging that show Modic changes at the respective levels of the planned ablation.  The patient has difficulty with forward flexion and/or sitting.  Conservative management including at least six weeks of a physical therapy program, supervised home exercise program as recommended by a healthcare provider, and oral medications.  The patient has continued the prescribed exercises since, without any significant long term relief.  The patient's pain continues to functionally limiting.  Hence, a basivertebral nerve radiofrequency ablation is medically necessary to reduce the vertebrogenic pain secondary the endplate change.

## 2023-08-02 ENCOUNTER — Encounter: Payer: PRIVATE HEALTH INSURANCE | Attending: Physical Medicine & Rehabilitation | Primary: Family Medicine

## 2023-09-08 ENCOUNTER — Ambulatory Visit
Admit: 2023-09-08 | Discharge: 2023-09-08 | Payer: PRIVATE HEALTH INSURANCE | Attending: Physical Medicine & Rehabilitation | Primary: Family Medicine

## 2023-09-08 DIAGNOSIS — M5451 Vertebrogenic low back pain: Secondary | ICD-10-CM

## 2023-09-08 NOTE — Assessment & Plan Note (Signed)
Linda Conley has low back pain secondary to endplate changes and seen on recent MRI imaging that show Modic changes at the respective levels of the planned ablation.  The patient has difficulty with forward flexion and/or sitting.  Conservative management including at least six weeks of a physical therapy program, supervised home exercise program as recommended by a healthcare provider, and oral medications.  The patient has continued the prescribed exercises since, without any significant long term relief.  The patient's pain continues to functionally limiting.  Hence, a basivertebral nerve radiofrequency ablation is medically necessary to reduce the vertebrogenic pain secondary the endplate change.     Further, there is no history of psychological issues nor recent drug/alcohol abuse that would preclude Linda Conley from having a basivertebral nerve ablation.

## 2023-09-08 NOTE — Progress Notes (Signed)
[address]  [place], [place] [address]    [address]  [place], [place] [address]    Phone: [phone]  Fax:      [phone]  E-mail: [email or web address]         PATIENT NAME:  Linda Conley   PATIENT DOB:  [birth year]   PCP:   Marlan Palau, MD   PROVIDER:  Debera Lat, MD   ENCOUNTER DATE: 09/08/2023  OTHER PARTICIPANT: none    Chief complaint/History of Present Illness:  Linda Conley is a pleasant 43 y.o. year old female who presents for a follow up evaluation of her Low Back pain without radiation.     Linda Conley rates her pain as 9/10 VAS at its worst.  The pain is described as uncomfortable and pressure and can be exacerbated by bending sideways and laying on left side while alleviated by laying down.      Linda Conley has had two interlaminar epidural steroid injections in 2022 and 2023. She felt significant relief from these but both were short lived. She is looking for something to help her lower back pain that will last more long term.     She feels the pain is exacerbated with forward flexion. She feels pain especially when sitting for a long period of time.     Linda Conley had an MRI of the lumbosacral spine at [facility] on 07/14/23 that shows obvious type I modic changes seen at the L4-5 level and milder type II modic changes at the L5-S1 levels.  Approach measured at 13.5cm on the RIGHT L4 level, 13.92cm at the RIGHT L5 level, 13.39cm at the RIGHT S1 level.        History:  Past Medical History:   Diagnosis Date    Anxiety     Back pain     Cancer (Multi-HCC)      Past Surgical History:   Procedure Laterality Date    BREAST SURGERY N/A     COLON SURGERY       Family History   Problem Relation Name Age of Onset    Lumbar disc disease Mother      Lumbar disc disease Father        Social History     Socioeconomic History    Marital status: Married     Spouse name: Not on file    Number of children: Not on file    Years of education: Not on file    Highest education level: Not on file   Occupational  History    Not on file   Tobacco Use    Smoking status: Never    Smokeless tobacco: Never   Substance and Sexual Activity    Alcohol use: Not Currently    Drug use: Never    Sexual activity: Defer   Other Topics Concern    Not on file   Social History Narrative    Not on file     Social Determinants of Health     Financial Resource Strain: Not on file   Food Insecurity: Not on file   Transportation Needs: Not on file   Physical Activity: Not on file   Stress: Not on file   Social Connections: Not on file   Intimate Partner Violence: Not on file   Housing Stability: Not on file      Erythromycin    Medications:  Current Outpatient Medications   Medication Instructions    albuterol 90 mcg/actuation inhaler ProAir HFA 90  mcg/actuation aerosol inhaler    benzonatate (Tessalon) 200 mg capsule benzonatate 200 mg capsule    buPROPion XL (WELLBUTRIN XL) 300 mg, oral, Daily    clindamycin (Cleocin T) 1 % external solution Daily    Clomid 50 mg tablet oral    codeine-guaifenesin (Robitussin-AC) 10-100 mg/5 mL syrup 10 mL, Every 8 hours    codeine-guaifenesin (Robitussin-AC) 10-100 mg/5 mL syrup 10 mL, 3 times daily PRN    gabapentin (NEURONTIN) 300 mg, oral, 3 times daily    ondansetron ODT (Zofran-ODT) 4 mg disintegrating tablet ondansetron 4 mg disintegrating tablet    sertraline (ZOLOFT) 50 mg, oral, Daily    traZODone (DESYREL) 150 mg, oral, Nightly PRN       Review of Systems:  Pertinent items are noted in HPI    PHYSICAL EXAMINATION:  GENERAL: well appearing, alert and oriented x3, appropriate mood/affect in no acute distress  CHEST: symmetric chest rise, no shortness of breath  HEENT: NC/AT  SKIN: No acute lesions affecting the face  CEREBELLAR: Coordination is grossly intact    Assessment/Plan:    Spinal anatomy using the patient's imaging as an example was reviewed.  Management including physical therapy, spinal injections, and surgery was discussed.  A stretching and exercise program was presented and  encouraged.    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  06/07/23  Debera Lat, MD, Physical Medicine & Rehabilitation:   6 month history of nonradiating low back pain when bending forward.  Previously learned PT exercises were unsuccessful. EXAM; pain with forward lumbar flexion.  REC; new MRI to further qualify endplate changes and consider bvn ablation  07/21/23  Debera Lat, MD, Physical Medicine & Rehabilitation:  significant relief from oral steroid with returning low back pain.  Reviewed/interpreted MR LS spine.  EXAM: pain with forward flexion REC: L4-S1  basivertebral ablation  09/08/2023 Debera Lat, MD, Physical Medicine & Rehabilitation:  follow up evaluation for low back pain with forward flexion.  Pain has been improved by recent steroid courses, albeit incompletely.  Radiating pain has resolved, but back pain persists.  REC: basivertebral nerve radiofrequency ablation at the L4, L5, S1 levels.  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  Interfaced with radiology staff to have Dr. Roseanne Reno (reading radiologist) add an addendum including the modic typing where he sees marrow changes.         Current Assessment & Plan     Linda Conley has low back pain secondary to endplate changes and seen on recent MRI imaging that show Modic changes at the respective levels of the planned ablation.  The patient has difficulty with forward flexion and/or sitting.  Conservative management including at least six weeks of a physical therapy program, supervised home exercise program as recommended by a healthcare provider, and oral medications.  The patient has continued  the prescribed exercises since, without any significant long term relief.  The patient's pain continues to functionally limiting.  Hence, a basivertebral nerve radiofrequency ablation is medically necessary to reduce the vertebrogenic pain secondary the endplate change.  Further, there is no history of psychological issues nor recent drug/alcohol abuse that would preclude Jilliann from having a basivertebral nerve ablation.         Relevant Orders    AMBULATORY IN-OFFICE PROCEDURE       Other    Abnormal MRI, lumbar spine R93.7    Overview     07/14/23: MRI LS spine, Linda Conley  FINDINGS:  -   Five non-rib-bearing lumbar type vertebra.   -   Bones: No acute fracture.  Degenerative fatty marrow changes seen   along the L4-L5 endplates and anterior L5-S1 endplates.  No   suspicious marrow replacing lesion or focal bone marrow edema is   identified.  The visualized vertebral body heights are preserved.   -   Alignment: The usual lumbar lordosis is preserved. There is unchanged   grade 1 retrolisthesis of L5 over S1.   -   Intervertebral discs/Joints: There is loss of disc signal and   intervertebral disc space height at the L4-L5 level with associated   disc bulging and herniation.  Additional significant findings by   level described below.   -   Thecal sac: The conus medullaris terminates at the L1-L2 level.  The   visualized distal spinal cord and cauda equina nerve roots are   unremarkable.   -   Paravertebral soft tissue: Unremarkable.   -   T12-L1: No significant posterior disc abnormality, spinal canal or   neural foraminal stenosis.   -   L1-L2: No significant posterior disc abnormality, spinal canal or   neural foraminal stenosis.   -   L2-L3: No significant posterior disc abnormality, spinal canal or   neural foraminal stenosis.   -   L3-L4: No significant posterior disc abnormality, spinal canal or   neural foraminal stenosis.   -   L4-L5: There is left asymmetric disc bulging with a superimposed   small left  paracentral/subarticular disc protrusion, bilateral facet   hypertrophy and ligamentum flavum thickening resulting in mild spinal   canal stenosis, moderate left and mild right subarticular zone   stenoses and mild left and minimal right neural foraminal stenoses   with possible impingement on the descending left L5 nerve roots in   the discal left subarticular zone.  The previously noted right   paracentral/subarticular disc protrusion at this level is no longer   visualized.   -   L5-S1: No significant posterior disc abnormality, spinal canal or   neural foraminal stenosis.     01/17/21: MR LS spine, Linda Conley  06/07/23 Debera Lat, MD addendum: Type II Modic changes in the superior endplate of L5 and inferior endplate of L4.  Bone: There is no spondylolisthesis. There is no loss of vertebral body  height. There is disc space narrowing with partial loss of T2 signal intensity in the L4-5 disc. There is mild to moderate  endplate degenerative change with only very mild marrow edema.  T12-L1: Unremarkable.  L1-2: Unremarkable.  L2-3: Unremarkable.  L3-4: There is a minimal disc bulge and mild bilateral facet joint  degenerative change without central spinal stenosis.  L4-5: There is moderate central spinal stenosis secondary to a broad-based  right eccentric posterior disc extrusion and mild bilateral facet joint degenerative change. There is severe right subarticular  recess stenosis with likely compression of the traversing right L5 nerve root. The disc extrusion measures 0.7 AP x 1.3 LR  x 1.3 SI cm.  L5-S1: There is a mild broad-based posterior central disc  protrusion and  mild bilateral facet joint degenerative change without central spinal stenosis.             Total time spent (excluding billable services): 30 minutes for this follow visit.      Preparing to see the patient.       Jansen review and reconciliation of medical/surgical history, medications, review of systems,       Time the provider spends taking  a patient's history.      Performing a medically appropriate exam and/or evaluation.      Counseling and educating the patient/family/caregiver.      Ordering medications, tests, or procedures.      Referring and communicating with other healthcare professionals.      Time spent documenting clinical information.      Independent interpretation of results and communicating results to the patient/family/caregiver and care coordination       Debera Lat, MD, [facility]  Physical Medicine & Rehabilitation, [facility]  Assistant Clinical Professor, [facility] of Medicine

## 2023-09-21 NOTE — Telephone Encounter (Signed)
LVM to schedule intracept procedure

## 2023-09-22 NOTE — Telephone Encounter (Signed)
Scheduled intracept for 2/13. Reminded pt to d/c wegovy for two weeks prior as well as NSAIDs for three days prior.

## 2023-10-01 NOTE — Telephone Encounter (Signed)
error 

## 2023-10-14 ENCOUNTER — Encounter: Payer: PRIVATE HEALTH INSURANCE | Attending: Physical Medicine & Rehabilitation | Primary: Family Medicine

## 2023-10-14 ENCOUNTER — Ambulatory Visit
Admit: 2023-10-14 | Discharge: 2023-10-15 | Payer: PRIVATE HEALTH INSURANCE | Attending: Physical Medicine & Rehabilitation | Primary: Family Medicine

## 2023-10-14 DIAGNOSIS — M5451 Vertebrogenic low back pain: Secondary | ICD-10-CM

## 2023-10-14 MED ORDER — oxyCODONE-acetaminophen (Percocet) 5-325 mg tablet
5-325 | ORAL_TABLET | Freq: Four times a day (QID) | ORAL | 0 refills | Status: AC | PRN
Start: 2023-10-14 — End: 2023-10-17

## 2023-10-14 MED ORDER — naloxone (Narcan) 4 mg/0.1 mL nasal spray
4 | NASAL | 0 refills | Status: AC | PRN
Start: 2023-10-14 — End: 2023-10-16

## 2023-10-14 NOTE — Progress Notes (Signed)
PATIENT NAME: Linda Conley   PATIENT DOB:  1981-02-03   PCP:   Marlan Palau, MD  SURGEON:  Debera Lat, MD  ASSISTANT:  none  SURGERY DATE: 10/14/2023    Anesthesia:   moderate sedation  Volume Given:  500 mL normal saline  Estimated blood loss: Minimal  Disposition:   stable to recovery  Specimens:   None    PROCEDURE NOTE  AMBULATORY IN-OFFICE PROCEDURE  Performed by: Jerilee Field, MD  Authorized by: Jerilee Field, MD    Procedure Details:    Pre-op Diagnosis:  Vertebrogenic low back pain, Modic changes L4, L5, and S1  Post-op Diagnosis:  Vertebrogenic low back pain, Modic changes L4, L5, and S1    Procedure: Basivertebral nerve (BVN) ablation- Intracept Procedure L4 and L5    Anesthesia: moderate sedation    Indications for Procedure: Brizeyda Holtmeyer has low back pain secondary to endplate changes and seen on recent MRI imaging that show Modic changes at the respective levels of the planned ablation.  The patient has difficulty with forward flexion and/or sitting.  Conservative management including at least six weeks of a physical therapy program, supervised home exercise program as recommended by a healthcare provider, and oral medications.  The patient has continued the prescribed exercises since, without any significant long term relief.  The patient's pain continues to functionally limiting.    Procedure Time Out:   Patient ID confirmed, correct procedure to be performed, correct site and/or side for procedure as per marked location and correct medication(s), including antibiotic to be used for the procedure.    Description of Procedure:   After receiving anesthesia in the supine position, the patient was placed prone on the operating room table and all pressure points were appropriately padded. The back was sterilely prepped and draped. The C-arm was sterilely draped and moved into position to visualize the L5 vertebral body in the AP and lateral plane. The C-arm was rotated to square off the  superior endplate at L5 and rotated to obtain an oblique view. The superolateral L5 pedicle was identified, and the skin entry point identified and infiltrated with 1% lidocaine using a 25-gauge 1-1/2 inch needle. A 22-gauge 5-inch spinal needle was used to anesthetize the track to the pedicle and periosteum and confirm the introducer cannula trajectory. A skin incision was made with  #7 scalpel blade. The introducer cannula with bevel tip was then introduced through the skin, subcutaneous tissue and paraspinal muscle until bony contact was made. The position was checked in the AP and Lateral plane. Using a mallet, the trocar was then advanced thru the pedicle to the posterior aspect of the vertebral body using a combination of AP and lateral views to ensure appropriate traversing of the pedicle and no breaching of the pedicle medially. Once the trocar was in the posterior aspect of the L5 vertebral body, the trocar was removed from the cannula and the curved cannula assembly (CCA) with the nitinol J-stylet was inserted. The spin wheel was rotated counterclockwise permitting excursion of the J-stylet. The curved cannula assembly was then advanced using a mallet in 1-2 mm increments. The J-stylet was observed to traverse the vertebral body in the AP and lateral views. The J-stylet was removed and replaced with the straight stylet to reach the BVN target. Upon reaching the target, the stylet was removed. The bipolar radiofrequency (RF) probe was connected to the generator and then inserted into the introducer cannula in its ablation position. The spin wheel  was rotated clockwise to retract the PEEK sleeve to expose the proximal electrode on the radiofrequency probe. The BVN was then ablated using [employer]'s standard versus targeted RFG algorithm, as appropriate.    While the ablation was occurring at L5, the C-arm was moved to visualize the target at the superolateral aspect of the L4 vertebral body using the  approach similar to the L5 vertebral body. The C-arm was rotated to square off the superior endplate at L4 and rotated to obtain an oblique view. The superolateral L4 pedicle was identified, and the skin entry point identified and infiltrated with 1% lidocaine using a 25- gauge 1-1/2 inch needle. A 22-gauge 5-inch spinal needle was used to anesthetize the track to the pedicle and periosteum and confirm the introducer cannula trajectory. A skin incision was made with #7 scalpel blade. The introducer cannula with bevel tip was then introduced through the skin, subcutaneous tissue and paraspinal muscle until bony contact was made. The position was checked in the AP and Lateral plane. Using a mallet, the trocar was then advanced thru the pedicle to the posterior aspect of the vertebral body using a combination of AP and lateral views to ensure appropriate traversing of the pedicle and no breaching of the pedicle medially. Once the trocar was in the posterior aspect of the  L4 vertebral body, the trocar was removed from the cannula and the curved cannula assembly with the nitinol J-stylet was inserted. The spin wheel was rotated counterclockwise permitting excursion of the J-stylet. The curved cannula assembly was then advanced using a mallet in 1-2 mm increments. The J-stylet was observed to traverse the vertebral body in the AP and lateral views. The J-stylet was removed and replaced with the straight stylet to reach the BVN target. Upon reaching the target, the stylet was then removed. The bipolar radiofrequency (RF) probe was removed from the previous vertebral body, the tip cleaned and was inserted into the introducer cannula in its ablation position. The spin wheel was rotated clockwise to retract the PEEK sleeve to expose the proximal electrode on the radiofrequency probe. The BVN was then ablated using [employer]'s standard versus targeted RFG algorithm, as appropriate.    While there was an initial plan to  complete the S1 level, adequate evaluation of the sacrum could not be obtained secondary to patient body habitus. After advancing instrumentation in AP and oblique views, adequate visualization could not be established in the lateral view.  Hence, it was elected to discontinue the procedure to preserve patient safety.    With all ablations completed, the instruments were removed from the vertebral bodies. The surgical wounds were closed with dermabond and a sterile dressing was applied. The patient was returned to the supine position and the anesthesia reversed. The patient tolerated the procedure well and was brought to the recovery room.  The patient was provided post-op and follow up instructions.     Complications:  none   Estimate Blood Loss: <50cc        During the course of the procedure, multiple extra images were taken for each level to localize instrumentation to the correct positions secondary to the patient's body habitus. The radiology technologist was utilized to maneuver the fluoroscopy a greater degree to better visualize the location of the procedure end point.       Debera Lat, MD, [facility]  Physical Medicine & Rehabilitation, [facility]  Assistant Clinical Professor, [facility]

## 2023-10-21 ENCOUNTER — Ambulatory Visit: Admit: 2023-10-21 | Discharge: 2023-10-21 | Payer: PRIVATE HEALTH INSURANCE | Primary: Family Medicine

## 2023-10-21 DIAGNOSIS — M5417 Radiculopathy, lumbosacral region: Secondary | ICD-10-CM

## 2023-10-21 NOTE — Progress Notes (Signed)
 [address]  [place], [place] [address]    [address]  [place], [place] [address]    Phone: [phone]  Fax:      [phone]  E-mail: [email or web address]     PATIENT NAME:  Linda Conley   PATIENT DOB:  08-Jan-1981   PCP:   Marlan Palau, MD   PROVIDER:  Narda Bonds PA-C         Chief complaint/History of Present Illness:  Linda Conley is a pleasant 43 y.o. year old female who presents for a follow up evaluation of her Low Back pain without radiation.     Linda Conley rates her pain as 9/10 VAS at its worst.  The pain is described as uncomfortable and pressure and can be exacerbated by bending sideways and laying on left side while alleviated by laying down.      Linda Conley has had two interlaminar epidural steroid injections in 2022 and 2023. She felt significant relief from these but both were short lived. She is looking for something to help her lower back pain that will last more long term.      She feels the pain is exacerbated with forward flexion. She feels pain especially when sitting for a long period of time.      Linda Conley had a basivertebral nerve ablation at the L4, L5, and S1 vertebrae on 10/14/23 with Dr. Jonathon Bellows. She is unsure if the procedure provided relief since she was not at her peak pain level when she got the procedure. She does note that her pain has been manageable and has not reached her peak pain levels since the procedure.  Endorse some left-sided thigh pain.  She denies incisional difficulty    Physical Exam:  General/Constitutional: No apparent distress. Well nourished and well developed.  Head / Face: Normocephalic atraumatic, Facial features symmetric.   Eyes: EOM grossly intact  Neurological: Alert and oriented X 3;  affect appropriate. patient cooperative, good historian.       Orthopedic Exam:   2 linear stab incisions with overlying glue.  No gapping drainage.  No surrounding erythema.  Patient is nontender palpation peri-incisionally    Diagnostic Tests  AMBULATORY  IN-OFFICE PROCEDURE  Jerilee Field, MD     10/14/2023 12:40 PM  AMBULATORY IN-OFFICE PROCEDURE  Performed by: Jerilee Field, MD  Authorized by: Jerilee Field, MD    Procedure Details:    Pre-op Diagnosis:  Vertebrogenic low back pain, Modic changes L4, L5,   and S1  Post-op Diagnosis:  Vertebrogenic low back pain, Modic changes L4, L5, and   S1    Procedure: Basivertebral nerve (BVN) ablation- Intracept Procedure L4 and   L5    Anesthesia: moderate sedation    Indications for Procedure: Linda Conley has low back pain secondary   to endplate changes and seen on recent MRI imaging that show Modic changes   at the respective levels of the planned ablation.  The patient has   difficulty with forward flexion and/or sitting.  Conservative management   including at least six weeks of a physical therapy program, supervised   home exercise program as recommended by a healthcare provider, and oral   medications.  The patient has continued the prescribed exercises since,   without any significant long term relief.  The patient's pain continues to   functionally limiting.    Procedure Time Out:   Patient ID confirmed, correct procedure to be performed, correct site   and/or side for procedure as  per marked location and correct   medication(s), including antibiotic to be used for the procedure.    Description of Procedure:   After receiving anesthesia in the supine position, the patient was placed   prone on the operating room table and all pressure points were   appropriately padded. The back was sterilely prepped and draped. The C-arm   was sterilely draped and moved into position to visualize the L5 vertebral   body in the AP and lateral plane. The C-arm was rotated to square off the   superior endplate at L5 and rotated to obtain an oblique view. The   superolateral L5 pedicle was identified, and the skin entry point   identified and infiltrated with 1% lidocaine using a 25-gauge 1-1/2 inch   needle. A 22-gauge 5-inch  spinal needle was used to anesthetize the track   to the pedicle and periosteum and confirm the introducer cannula   trajectory. A skin incision was made with  #7 scalpel blade. The   introducer cannula with bevel tip was then introduced through the skin,   subcutaneous tissue and paraspinal muscle until bony contact was made. The   position was checked in the AP and Lateral plane. Using a mallet, the   trocar was then advanced thru the pedicle to the posterior aspect of the   vertebral body using a combination of AP and lateral views to ensure   appropriate traversing of the pedicle and no breaching of the pedicle   medially. Once the trocar was in the posterior aspect of the L5 vertebral   body, the trocar was removed from the cannula and the curved cannula   assembly (CCA) with the nitinol J-stylet was inserted. The spin wheel was   rotated counterclockwise permitting excursion of the J-stylet. The curved   cannula assembly was then advanced using a mallet in 1-2 mm increments.   The J-stylet was observed to traverse the vertebral body in the AP and   lateral views. The J-stylet was removed and replaced with the straight   stylet to reach the BVN target. Upon reaching the target, the stylet was   removed. The bipolar radiofrequency (RF) probe was connected to the   generator and then inserted into the introducer cannula in its ablation   position. The spin wheel was rotated clockwise to retract the PEEK sleeve   to expose the proximal electrode on the radiofrequency probe. The BVN was   then ablated using [employer]'s standard versus targeted RFG algorithm, as   appropriate.    While the ablation was occurring at L5, the C-arm was moved to visualize   the target at the superolateral aspect of the L4 vertebral body using the   approach similar to the L5 vertebral body. The C-arm was rotated to square   off the superior endplate at L4 and rotated to obtain an oblique view. The   superolateral L4 pedicle was  identified, and the skin entry point   identified and infiltrated with 1% lidocaine using a 25- gauge 1-1/2 inch   needle. A 22-gauge 5-inch spinal needle was used to anesthetize the track   to the pedicle and periosteum and confirm the introducer cannula   trajectory. A skin incision was made with #7 scalpel blade. The introducer   cannula with bevel tip was then introduced through the skin, subcutaneous   tissue and paraspinal muscle until bony contact was made. The position was   checked in the AP and Lateral plane. Using  a mallet, the trocar was then   advanced thru the pedicle to the posterior aspect of the vertebral body   using a combination of AP and lateral views to ensure appropriate   traversing of the pedicle and no breaching of the pedicle medially. Once   the trocar was in the posterior aspect of the  L4 vertebral body, the   trocar was removed from the cannula and the curved cannula assembly with   the nitinol J-stylet was inserted. The spin wheel was rotated   counterclockwise permitting excursion of the J-stylet. The curved cannula   assembly was then advanced using a mallet in 1-2 mm increments. The   J-stylet was observed to traverse the vertebral body in the AP and lateral   views. The J-stylet was removed and replaced with the straight stylet to   reach the BVN target. Upon reaching the target, the stylet was then   removed. The bipolar radiofrequency (RF) probe was removed from the   previous vertebral body, the tip cleaned and was inserted into the   introducer cannula in its ablation position. The spin wheel was rotated   clockwise to retract the PEEK sleeve to expose the proximal electrode on   the radiofrequency probe. The BVN was then ablated using [employer]'s   standard versus targeted RFG algorithm, as appropriate.    While there was an initial plan to complete the S1 level, adequate   evaluation of the sacrum could not be obtained secondary to patient body   habitus. After advancing  instrumentation in AP and oblique views, adequate   visualization could not be established in the lateral view.  Hence, it was   elected to discontinue the procedure to preserve patient safety.    With all ablations completed, the instruments were removed from the   vertebral bodies. The surgical wounds were closed with dermabond and a   sterile dressing was applied. The patient was returned to the supine   position and the anesthesia reversed. The patient tolerated the procedure   well and was brought to the recovery room.  The patient was provided post-op and follow up instructions.     Complications:  none   Estimate Blood Loss: <50cc        Assessment & Plan  Problem List Items Addressed This Visit               ICD-10-CM             Musculoskeletal     Vertebrogenic low back pain - Primary M54.51     Overview       12/31/2022 [name]: MR LS spine significant for L4-5 right paracentral disc herniation/extrusion and type II Modic changes.  06/07/23  Debera Lat, MD, Physical Medicine & Rehabilitation:   6 month history of nonradiating low back pain when bending forward.  Previously learned PT exercises were unsuccessful. EXAM; pain with forward lumbar flexion.  REC; new MRI to further qualify endplate changes and consider bvn ablation  07/21/23  Debera Lat, MD, Physical Medicine & Rehabilitation:  significant relief from oral steroid with returning low back pain.  Reviewed/interpreted MR LS spine.  EXAM: pain with forward flexion REC: L4-S1  basivertebral ablation  09/08/2023 Debera Lat, MD, Physical Medicine & Rehabilitation:  follow up evaluation for low back pain with forward flexion.  Pain has been improved by recent steroid courses, albeit incompletely.  Radiating pain has resolved, but back pain persists.  REC: basivertebral nerve radiofrequency  ablation at the L4, L5, S1 levels.  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  Interfaced with radiology staff to have Dr. Roseanne Reno (reading radiologist) add an addendum including the modic typing where he sees marrow changes.           Current Assessment & Plan       Ronette has low back pain secondary to endplate changes and seen on recent MRI imaging that show Modic changes at the respective levels of the planned ablation.  The patient has difficulty with forward flexion and/or sitting.  Conservative management including at least six weeks of a physical therapy program, supervised home exercise program as recommended by a healthcare provider, and oral medications.  The patient has continued the prescribed exercises since, without any significant long term relief.  The patient's pain continues to functionally limiting.  Hence, a basivertebral nerve radiofrequency ablation is medically necessary to reduce the vertebrogenic pain secondary the endplate change.      Further, there is no history of psychological issues nor recent drug/alcohol abuse that would preclude Adalyn from having a basivertebral nerve ablation.           Relevant Orders     AMBULATORY IN-OFFICE PROCEDURE             Other     Abnormal MRI, lumbar spine R93.7     Overview       07/14/23: MRI LS spine, [name]  FINDINGS:  -   Five non-rib-bearing lumbar type vertebra.   -   Bones: No acute fracture.  Degenerative fatty marrow changes seen   along the L4-L5 endplates and anterior L5-S1 endplates.  No   suspicious marrow replacing lesion or focal bone marrow edema is   identified.  The visualized vertebral body heights are preserved.   -   Alignment: The usual lumbar lordosis is preserved. There is unchanged   grade 1 retrolisthesis of L5 over S1.   -   Intervertebral discs/Joints: There is loss of disc signal and   intervertebral disc space height at the L4-L5 level with associated   disc bulging and herniation.  Additional significant findings by   level described below.   -    Thecal sac: The conus medullaris terminates at the L1-L2 level.  The   visualized distal spinal cord and cauda equina nerve roots are   unremarkable.   -   Paravertebral soft tissue: Unremarkable.   -   T12-L1: No significant posterior disc abnormality, spinal canal or   neural foraminal stenosis.   -   L1-L2: No significant posterior disc abnormality, spinal canal or   neural foraminal stenosis.   -   L2-L3: No significant posterior disc abnormality, spinal canal or   neural foraminal stenosis.   -   L3-L4: No significant posterior disc abnormality, spinal canal or   neural foraminal stenosis.   -   L4-L5: There is left asymmetric disc bulging with a superimposed   small left paracentral/subarticular disc protrusion, bilateral facet   hypertrophy and ligamentum flavum thickening resulting in mild spinal   canal stenosis, moderate left and mild right subarticular zone   stenoses and mild left and minimal right neural foraminal stenoses   with possible impingement on the descending left L5 nerve roots in   the discal left subarticular zone.  The previously noted right   paracentral/subarticular  disc protrusion at this level is no longer   visualized.   -   L5-S1: No significant posterior disc abnormality, spinal canal or   neural foraminal stenosis.      01/17/21: MR LS spine, [name]  06/07/23 Debera Lat, MD addendum: Type II Modic changes in the superior endplate of L5 and inferior endplate of L4.  Bone: There is no spondylolisthesis. There is no loss of vertebral body  height. There is disc space narrowing with partial loss of T2 signal intensity in the L4-5 disc. There is mild to moderate  endplate degenerative change with only very mild marrow edema.  T12-L1: Unremarkable.  L1-2: Unremarkable.  L2-3: Unremarkable.  L3-4: There is a minimal disc bulge and mild bilateral facet joint  degenerative change without central spinal stenosis.  L4-5: There is moderate central spinal stenosis secondary to a  broad-based  right eccentric posterior disc extrusion and mild bilateral facet joint degenerative change. There is severe right subarticular  recess stenosis with likely compression of the traversing right L5 nerve root. The disc extrusion measures 0.7 AP x 1.3 LR  x 1.3 SI cm.  L5-S1: There is a mild broad-based posterior central disc protrusion and  mild bilateral facet joint degenerative change without central spin        Reviewed PMH, PSH, med list and allergies.      Procedures     We discussed proper wound care.  Incisions are well-healed well-approximated with overlying glue.  There is no evidence of infection.  She was educated on these and encouraged to return if these were to occur.  Patient can titrate activity to tolerance.  She return for repeat evaluation as scheduled in 1 month with Dr. Jonathon Bellows.    Patient understands and agrees with the plan, and questions were answered at this time.     Lacoya Wilbanks PA-C        20 minutes were spent with the patient taking a history, performing an examination, reviewing studies, discussing the medical problem,  recommending treatment and documenting the encounter.        No primary diagnosis found.      Past Medical History:   Diagnosis Date    Anxiety     Back pain     Cancer (Multi-HCC)        Past Surgical History:   Procedure Laterality Date    BREAST SURGERY N/A     COLON SURGERY         Social History     Occupational History    Not on file   Tobacco Use    Smoking status: Never    Smokeless tobacco: Never   Substance and Sexual Activity    Alcohol use: Not Currently    Drug use: Never    Sexual activity: Defer       Current Outpatient Medications   Medication Instructions    albuterol 90 mcg/actuation inhaler ProAir HFA 90 mcg/actuation aerosol inhaler    benzonatate (Tessalon) 200 mg capsule benzonatate 200 mg capsule    buPROPion XL (WELLBUTRIN XL) 300 mg, oral, Daily    clindamycin (Cleocin T) 1 % external solution Daily    Clomid 50 mg tablet oral     codeine-guaifenesin (Robitussin-AC) 10-100 mg/5 mL syrup 10 mL, Every 8 hours    codeine-guaifenesin (Robitussin-AC) 10-100 mg/5 mL syrup 10 mL, 3 times daily PRN    gabapentin (NEURONTIN) 300 mg, oral, 3 times daily    ondansetron ODT (Zofran-ODT) 4 mg  disintegrating tablet ondansetron 4 mg disintegrating tablet    sertraline (ZOLOFT) 50 mg, oral, Daily    traZODone (DESYREL) 150 mg, oral, Nightly PRN       Allergies   Allergen Reactions    Erythromycin      Other reaction(s): Other (See Comments)  Vomiting and diarrhea

## 2023-11-15 ENCOUNTER — Ambulatory Visit
Admit: 2023-11-15 | Discharge: 2023-11-15 | Payer: PRIVATE HEALTH INSURANCE | Attending: Physical Medicine & Rehabilitation | Primary: Family Medicine

## 2023-11-15 DIAGNOSIS — R937 Abnormal findings on diagnostic imaging of other parts of musculoskeletal system: Secondary | ICD-10-CM

## 2023-11-15 NOTE — Progress Notes (Signed)
 [address]  [place], [place] [address]    [address]  [place], [place] [address]    Phone: [phone]  Fax:      [phone]  E-mail: [email or web address]         PATIENT NAME:  Linda Conley   PATIENT DOB:  [birth year]   PCP:   Marlan Palau, MD   PROVIDER:  Debera Lat, MD   ENCOUNTER DATE: 11/15/2023  OTHER PARTICIPANT: none      Chief complaint/History of Present Illness:  Linda Conley is a pleasant 43 y.o. year old female who presents for a follow up evaluation of her Low Back pain without radiation.     [name] rates her pain as 10/10 VAS at its worst.  The pain is described as uncomfortable and pressure and can be exacerbated by bending sideways and laying on left side while alleviated by laying down.      [name] has had two interlaminar epidural steroid injections in 2022 and 2023. She felt significant relief from these but both were short lived.     She feels the pain is exacerbated with forward flexion. She feels pain especially when sitting for a long period of time.      [name] had an MRI of the lumbosacral spine at [facility] on 07/14/23 that was significant for obvious type I modic changes seen at the L4-5 level and milder type II modic changes at the L5-S1 levels.  Approach measured at 13.5cm on the RIGHT L4 level, 13.92cm at the RIGHT L5 level, 13.39cm at the RIGHT S1 level.     Most recently, on 10/14/23 [name] had an L4 and L5 Basivertebral nerve ablation.  She finds complete relief of her low back pain.    History of Present Illness  She reports an overall improvement in her condition, with no significant pain experienced immediately following the procedure or during the subsequent week. She has not encountered any complications related to the incision site. However, she does experience intermittent soreness at the right incision site, which she attributes to the healing process. This soreness is not constant and is not exacerbated by touch but rather by movement. The discomfort is  described as a twinge and is localized to the superficial tissue. She notes a slight improvement in the soreness over time. She expresses no concern regarding potential scarring.        History:  Past Medical History:   Diagnosis Date    Anxiety     Back pain     Cancer (Multi-HCC)      Past Surgical History:   Procedure Laterality Date    BREAST SURGERY N/A     COLON SURGERY       Family History   Problem Relation Name Age of Onset    Lumbar disc disease Mother      Lumbar disc disease Father        Social History     Socioeconomic History    Marital status: Married     Spouse name: Not on file    Number of children: Not on file    Years of education: Not on file    Highest education level: Not on file   Occupational History    Not on file   Tobacco Use    Smoking status: Never    Smokeless tobacco: Never   Substance and Sexual Activity    Alcohol use: Not Currently    Drug use: Never  Sexual activity: Defer   Other Topics Concern    Not on file   Social History Narrative    Not on file     Social Determinants of Health     Financial Resource Strain: Not on file   Food Insecurity: Not on file   Transportation Needs: Not on file   Physical Activity: Not on file   Stress: Not on file   Social Connections: Not on file   Intimate Partner Violence: Not on file   Housing Stability: Not on file      Erythromycin    Medications:  Current Outpatient Medications   Medication Instructions    albuterol 90 mcg/actuation inhaler ProAir HFA 90 mcg/actuation aerosol inhaler    benzonatate (Tessalon) 200 mg capsule benzonatate 200 mg capsule    buPROPion XL (WELLBUTRIN XL) 300 mg, oral, Daily    clindamycin (Cleocin T) 1 % external solution Daily    Clomid 50 mg tablet Take by mouth.    codeine-guaifenesin (Robitussin-AC) 10-100 mg/5 mL syrup 10 mL, Every 8 hours    codeine-guaifenesin (Robitussin-AC) 10-100 mg/5 mL syrup 10 mL, 3 times daily PRN    gabapentin (NEURONTIN) 300 mg, oral, 3 times daily    ondansetron ODT  (Zofran-ODT) 4 mg disintegrating tablet ondansetron 4 mg disintegrating tablet    sertraline (ZOLOFT) 50 mg, oral, Daily    traZODone (DESYREL) 150 mg, oral, Nightly PRN       Review of Systems:  Pertinent items are noted in HPI    PHYSICAL EXAMINATION:  GENERAL: well appearing, alert and oriented x3, appropriate mood/affect in no acute distress. Morbidly obese.  CHEST: symmetric chest rise, no shortness of breath  HEENT: NC/AT  SKIN: No acute lesions affecting the face  CEREBELLAR: Coordination is grossly intact       Assessment/Plan:    Spinal anatomy using the patient's imaging as an example was reviewed.  Management including physical therapy, spinal injections, and surgery was discussed.  A stretching and exercise program was presented and encouraged.    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  06/07/23  Debera Lat, MD, Physical Medicine & Rehabilitation:   6 month history of nonradiating low back pain when bending forward.  Previously learned PT exercises were unsuccessful. EXAM; pain with forward lumbar flexion.  REC; new MRI to further qualify endplate changes and consider bvn ablation  07/21/23  Debera Lat, MD, Physical Medicine & Rehabilitation:  significant relief from oral steroid with returning low back pain.  Reviewed/interpreted MR LS spine.  EXAM: pain with forward flexion REC: L4-S1  basivertebral ablation  09/08/2023 Debera Lat, MD, Physical Medicine & Rehabilitation:  follow up evaluation for low back pain with forward flexion.  Pain has been improved by recent steroid courses, albeit incompletely.  Radiating pain has resolved, but back pain persists.  REC: basivertebral nerve radiofrequency ablation at the L4, L5, S1 levels.  She has a BMI >42 and the angle of  approach an skin to lesion distance for this procedure has been measured and is appropriate and within instrumentation constraints.  Interfaced with radiology staff to have Dr. Roseanne Reno (reading radiologist) add an addendum including the modic typing where he  sees marrow changes.  10/14/23: Debera Lat, MD, Physical Medicine & Rehabilitation:  L4 and L5 Basivertebral nerve ablation with 100% pain relief         Current Assessment & Plan     Considering the significant ongoing relief secondary to the spinal injection, I would defer performing any further interventions at this juncture; however, should the patient have a return in pain, I would be happy to see the patient again.  The patient was instructed to continue their physician directed home exercise program and medications should there be a painful exacerbation.             Other    Abnormal MRI, lumbar spine - Primary R93.7    Overview     07/14/23: MRI LS spine, [name]  FINDINGS:  -   Five non-rib-bearing lumbar type vertebra.   -   Bones: No acute fracture.  Degenerative fatty marrow changes seen   along the L4-L5 endplates and anterior L5-S1 endplates.  No   suspicious marrow replacing lesion or focal bone marrow edema is   identified.  The visualized vertebral body heights are preserved.   -   Alignment: The usual lumbar lordosis is preserved. There is unchanged   grade 1 retrolisthesis of L5 over S1.   -   Intervertebral discs/Joints: There is loss of disc signal and   intervertebral disc space height at the L4-L5 level with associated   disc bulging and herniation.  Additional significant findings by   level described below.   -   Thecal sac: The conus medullaris terminates at the L1-L2 level.  The   visualized distal spinal cord and cauda equina nerve roots are   unremarkable.   -   Paravertebral soft tissue: Unremarkable.   -   T12-L1: No significant posterior disc abnormality, spinal canal or   neural foraminal stenosis.   -   L1-L2: No significant  posterior disc abnormality, spinal canal or   neural foraminal stenosis.   -   L2-L3: No significant posterior disc abnormality, spinal canal or   neural foraminal stenosis.   -   L3-L4: No significant posterior disc abnormality, spinal canal or   neural foraminal stenosis.   -   L4-L5: There is left asymmetric disc bulging with a superimposed   small left paracentral/subarticular disc protrusion, bilateral facet   hypertrophy and ligamentum flavum thickening resulting in mild spinal   canal stenosis, moderate left and mild right subarticular zone   stenoses and mild left and minimal right neural foraminal stenoses   with possible impingement on the descending left L5 nerve roots in   the discal left subarticular zone.  The previously noted right   paracentral/subarticular disc protrusion at this level is no longer   visualized.   -   L5-S1: No significant posterior disc abnormality, spinal canal or   neural foraminal stenosis.     01/17/21: MR LS spine, [name]  06/07/23 Debera Lat, MD addendum: Type II Modic changes in the superior endplate of L5 and inferior endplate of L4.  Bone: There is no spondylolisthesis. There is no loss of vertebral body  height. There is disc space narrowing with partial loss of T2 signal intensity in the L4-5 disc. There is mild to moderate  endplate degenerative change with only very mild marrow edema.  T12-L1: Unremarkable.  L1-2: Unremarkable.  L2-3: Unremarkable.  L3-4: There is a minimal disc bulge and mild bilateral facet joint  degenerative change without central  spinal stenosis.  L4-5: There is moderate central spinal stenosis secondary to a broad-based  right eccentric posterior disc extrusion and mild bilateral facet joint degenerative change. There is severe right subarticular  recess stenosis with likely compression of the traversing right L5 nerve root. The disc extrusion measures 0.7 AP x 1.3 LR  x 1.3 SI cm.  L5-S1: There is a mild broad-based posterior central disc  protrusion and  mild bilateral facet joint degenerative change without central spinal stenosis.             This visit note was drafted with the help of artificial intelligence. I obtained consent to record the visit from all participants for this purpose.    Jerilee Field, MD       Total time spent (excluding billable services): 20 minutes for this follow up visit.      Preparing to see the patient.       Marie review and reconciliation of medical/surgical history, medications, review of systems,       Time the provider spends taking a patient's history.      Performing a medically appropriate exam and/or evaluation.      Counseling and educating the patient/family/caregiver.      Ordering medications, tests, or procedures.      Referring and communicating with other healthcare professionals.      Time spent documenting clinical information.      Independent interpretation of results and communicating results to the patient/family/caregiver and care coordination       Debera Lat, MD, [facility]  Physical Medicine & Rehabilitation, [facility]  Assistant Clinical Professor, [facility]

## 2023-11-15 NOTE — Assessment & Plan Note (Signed)
 Considering the significant ongoing relief secondary to the spinal injection, I would defer performing any further interventions at this juncture; however, should the patient have a return in pain, I would be happy to see the patient again.  The patient was instructed to continue their physician directed home exercise program and medications should there be a painful exacerbation.

## 2023-11-27 ENCOUNTER — Inpatient Hospital Stay
Admit: 2023-11-27 | Disposition: A | Discharge status: Home / Self Care | Payer: PRIVATE HEALTH INSURANCE | Attending: Physician Assistant | Primary: Family Medicine

## 2023-11-27 DIAGNOSIS — J020 Streptococcal pharyngitis: Secondary | ICD-10-CM

## 2023-11-27 LAB — POCT SARS-COV-2/FLU A&B
POCT Flu A: NOT DETECTED
POCT Flu B: NOT DETECTED
POCT SARS-CoV-2: NOT DETECTED

## 2023-11-27 LAB — POCT STREP A PCR: POCT Strep A PCR: DETECTED — AB

## 2023-11-27 MED ORDER — ibuprofen 600 mg tablet  - Omnicell Override Pull
600 | ORAL | Status: AC
Start: 2023-11-27 — End: ?

## 2023-11-27 MED ORDER — amoxicillin (Amoxil) 500 mg tablet
500 | ORAL_TABLET | Freq: Two times a day (BID) | ORAL | 0 refills | Status: AC
Start: 2023-11-27 — End: 2023-12-07

## 2023-11-27 MED ORDER — ibuprofen 200 mg tablet  - Omnicell Override Pull
200 | ORAL | Status: AC
Start: 2023-11-27 — End: ?

## 2023-11-27 MED ORDER — ibuprofen tablet 800 mg
600 | Freq: Once | ORAL | Status: AC
Start: 2023-11-27 — End: 2023-11-27
  Administered 2023-11-27: 12:00:00 800 mg via ORAL

## 2023-11-27 MED FILL — IBUPROFEN 600 MG TABLET: 600 600 mg | ORAL | Qty: 1

## 2023-11-27 MED FILL — IBUPROFEN 200 MG TABLET: 200 200 mg | ORAL | Qty: 1

## 2023-11-27 NOTE — Other (Signed)
 Patient Education  Table of Contents   Strep Throat, Adult   Strep Throat, Adult    To view videos and all your education online visit,  [email or web address].[email or web address]  or scan this QR code with your smartphone.  Access to this content will expire in one year.  Strep Throat, Adult  Strep throat is an infection in the throat that is caused by bacteria. It is common during the cold months of the year. It mostly affects children who are 16?44 years old. However, people of all ages can get it at any time of the year. This infection spreads from person to person (is contagious) through coughing, sneezing, or having close contact.  Your health care provider may use other names to describe the infection. When strep throat affects the tonsils, it is called tonsillitis. When it affects the back of the throat, it is called pharyngitis.  What are the causes?  This condition is caused by the Streptococcus pyogenes bacteria.  What increases the risk?  You are more likely to develop this condition if:   You care for school-age children, or are around school-age children. Children are more likely to get strep throat and may spread it to others.   You spend time in crowded places where the infection can spread easily.   You have close contact with someone who has strep throat.  What are the signs or symptoms?  Symptoms of this condition include:   Fever or chills.   Redness, swelling, or pain in the tonsils or throat.   Pain or difficulty when swallowing.   White or yellow spots on the tonsils or throat.   Tender glands in the neck and under the jaw.   Bad smelling breath.   Red rash all over the body. This is rare.  How is this diagnosed?    This condition is diagnosed by tests that check for the presence and the amount of bacteria that cause strep throat. They are:   Rapid strep test. Your throat is swabbed and checked for the presence of bacteria. Results are usually ready in minutes.   Throat culture test. Your throat is  swabbed. The sample is placed in a cup that allows infections to grow. Results are usually ready in 1 or 2 days.  How is this treated?  This condition may be treated with:   Medicines that kill germs (antibiotics).   Medicines that relieve pain or fever. These include:  ? Ibuprofen or acetaminophen.  ? Aspirin, only for people who are over the age of 80.  ? Throat lozenges.  ? Throat sprays.  Follow these instructions at home:  Medicines     Take over-the-counter and prescription medicines only as told by your health care provider.   Take your antibiotic medicine as told by your health care provider. Do not stop taking the antibiotic even if you start to feel better.  Eating and drinking     If you have trouble swallowing, try eating soft foods until your sore throat feels better.   Drink enough fluid to keep your urine pale yellow.   To help relieve pain, you may have:   ? Warm fluids, such as soup and tea.  ? Cold fluids, such as frozen desserts or popsicles.  General instructions   Gargle with a salt-water mixture 3?4 times a day or as needed. To make a salt-water mixture, completely dissolve ??1 tsp (3?6 g) of salt in 1 cup (237 mL) of warm water.   Get  plenty of rest.   Stay home from work or school until you have been taking antibiotics for 24 hours.   Do not use any products that contain nicotine or tobacco. These products include cigarettes, chewing tobacco, and vaping devices, such as e-cigarettes. If you need help quitting, ask your health care provider.   It is up to you to get your test results. Ask your health care provider, or the department that is doing the test, when your results will be ready.   Keep all follow-up visits. This is important.  How is this prevented?     Do not share food, drinking cups, or personal items that could cause the infection to spread to other people.   Wash your hands often with soap and water for at least 20 seconds. If soap and water are not available, use hand  sanitizer. Make sure that all people in your house wash their hands well.   Have family members tested if they have a sore throat or fever. They may need an antibiotic if they have strep throat.  Contact a health care provider if:   You have swelling in your neck that keeps getting bigger.   You develop a rash, cough, or earache.   You cough up a thick mucus that is green, yellow-Brandstetter, or bloody.   You have pain or discomfort that does not get better with medicine.   Your symptoms seem to be getting worse.   You have a fever.  Get help right away if:   You have new symptoms, such as vomiting, severe headache, stiff or painful neck, chest pain, or shortness of breath.   You have severe throat pain, drooling, or changes in your voice.   You have swelling of the neck, or the skin on the neck becomes red and tender.   You have signs of dehydration, such as tiredness (fatigue), dry mouth, and decreased urination.   You become increasingly sleepy, or you cannot wake up completely.   Your joints become red or painful.  These symptoms may represent a serious problem that is an emergency. Do not wait to see if the symptoms will go away. Get medical help right away. Call your local emergency services (911 in the [place] Do not drive yourself to the hospital.  Summary   Strep throat is an infection in the throat that is caused by the Streptococcus pyogenes bacteria. This infection is spread from person to person (is contagious) through coughing, sneezing, or having close contact.   Take your medicines, including antibiotics, as told by your health care provider. Do not stop taking the antibiotic even if you start to feel better.   To prevent the spread of germs, wash your hands well with soap and water. Have others do the same. Do not share food, drinking cups, or personal items.   Get help right away if you have new symptoms, such as vomiting, severe headache, stiff or painful neck, chest pain, or shortness of breath.  This  information is not intended to replace advice given to you by your health care provider. Make sure you discuss any questions you have with your health care provider.  Document Released: 2000-08-14 Document Updated: 2020-12-10 Document Reviewed: 2020-12-10  Elsevier Patient Education ? 2025 [employer]  Strep Throat, Adult  Strep throat is an infection of the throat. It is caused by germs (bacteria). Strep throat is common during the cold months of the year. It mostly affects children who are 5?15 years  old. However, people of all ages can get it at any time of the year. This infection spreads from person to person through coughing, sneezing, or having close contact.  What are the causes?  This condition is caused by the Streptococcus pyogenes germ.  What increases the risk?   You care for young children. Children are more likely to get strep throat and may spread it to others.   You go to crowded places. Germs can spread easily in such places.   You kiss or touch someone who has strep throat.  What are the signs or symptoms?   Fever or chills.    Redness, swelling, or pain in the tonsils or throat.    Pain or trouble when swallowing.   White or yellow spots on the tonsils or throat.    Tender glands in the neck and under the jaw.    Bad breath.    Red rash all over the body. This is rare.  How is this treated?   Medicines that kill germs (antibiotics).   Medicines that treat pain or fever. These include:  ? Ibuprofen or acetaminophen.   ? Aspirin, only for people who are over the age of 11.  ? Cough drops.  ? Throat sprays.  Follow these instructions at home:  Medicines     Take over-the-counter and prescription medicines only as told by your doctor.   Take your antibiotic medicine as told by your doctor. Do not stop taking the antibiotic even if you start to feel better.  Eating and drinking     If you have trouble swallowing, eat soft foods until your throat feels better.   Drink enough fluid to keep your pee  (urine) pale yellow.   To help with pain, you may have:  ? Warm fluids, such as soup and tea.  ? Cold fluids, such as frozen desserts or popsicles.  General instructions   Rinse your mouth (gargle) with a salt-water mixture 3?4 times a day or as needed. To make a salt-water mixture, dissolve ??1 tsp (3?6 g) of salt in 1 cup (237 mL) of warm water.   Rest as much as you can.   Stay home from work or school until you have been taking antibiotics for 24 hours.   Do not smoke or use any products that contain nicotine or tobacco. If you need help quitting, ask your doctor.   Keep all follow-up visits.  How is this prevented?     Do not share food, drinking cups, or personal items. They can cause the germs to spread.   Wash your hands well with soap and water. Make sure that all people in your house wash their hands well.   Have family members tested if they have a fever or a sore throat. They may need an antibiotic if they have strep throat.  Contact a doctor if:   You have swelling in your neck that keeps getting bigger.   You get a rash, cough, or earache.   You cough up a thick fluid that is green, yellow-Rigdon, or bloody.   You have pain that does not get better with medicine.   Your symptoms get worse instead of getting better.   You have a fever.  Get help right away if:   You vomit.   You have a very bad headache.   Your neck hurts or feels stiff.   You have chest pain or are short of breath.   You have drooling,  very bad throat pain, or changes in your voice.   Your neck is swollen, or the skin gets red and tender.   Your mouth is dry, or you are peeing less than normal.   You keep feeling more tired or have trouble waking up.   Your joints are red or painful.  These symptoms may be an emergency. Do not wait to see if the symptoms will go away. Get help right away. Call your local emergency services (911 in the [place]  Summary   Strep throat is an infection of the throat. It is caused by germs (bacteria).   This  infection can spread from person to person through coughing, sneezing, or having close contact.   Take your medicines, including antibiotics, as told by your doctor. Do not stop taking the antibiotic even if you start to feel better.   To prevent the spread of germs, wash your hands well with soap and water. Have others do the same. Do not share food, drinking cups, or personal items.   Get help right away if you have a bad headache, chest pain, shortness of breath, a stiff or painful neck, or you vomit.  This information is not intended to replace advice given to you by your health care provider. Make sure you discuss any questions you have with your health care provider.  Document Released: 2008-02-03 Document Updated: 2020-12-10 Document Reviewed: 2020-12-10  Elsevier Patient Education ? 2025 [employer].

## 2023-11-27 NOTE — ED Provider Notes (Signed)
 History  Chief Complaint   Patient presents with    Sore Throat     Pt c/o sore throat, body aches and vomited this morning      Patient is a 43 year old female presenting to the urgent care for an evaluation and treatment of a sore throat, body ache, nasal congestion, that began yesterday and she vomited once this morning.  Patient's son recently tested positive and was treated for strep and is wondering whether or not she may have it as well.  She denies any chest pain, shortness of breath, any abdominal pain, any diarrhea, rashes or lesions, any dizziness, or any other associated symptoms and is doing well otherwise.  She did try Tylenol yesterday without much improvement in symptoms.  She is up-to-date on her flu shot but not up-to-date on her COVID shot.      Patient Active Problem List   Diagnosis    Lumbar radiculopathy    Sprain of medial collateral ligament of left knee    L-S radiculopathy    Abnormal MRI, lumbar spine    Vertebrogenic low back pain       Past Medical History:   Diagnosis Date    Anxiety     Back pain     Cancer (Multi-HCC)        Past Surgical History:   Procedure Laterality Date    BREAST SURGERY N/A     COLON SURGERY         Family History   Problem Relation Name Age of Onset    Lumbar disc disease Mother      Lumbar disc disease Father         Social History     Tobacco Use    Smoking status: Never    Smokeless tobacco: Never   Substance Use Topics    Alcohol use: Not Currently    Drug use: Never       Review of Systems   Constitutional:  Positive for chills and fever. Negative for activity change and appetite change.   HENT:  Positive for sore throat. Negative for ear pain.    Eyes: Negative.  Negative for pain and visual disturbance.   Respiratory: Negative.  Negative for cough and shortness of breath.    Cardiovascular: Negative.  Negative for chest pain and palpitations.   Gastrointestinal:  Positive for vomiting. Negative for abdominal pain, diarrhea and nausea.   Endocrine:  Negative.    Genitourinary: Negative.  Negative for dysuria and hematuria.   Musculoskeletal:  Positive for myalgias. Negative for arthralgias and back pain.   Skin: Negative.  Negative for color change and rash.   Allergic/Immunologic: Negative.    Neurological:  Positive for headaches. Negative for dizziness, seizures, syncope, facial asymmetry, weakness, light-headedness and numbness.   Hematological: Negative.    Psychiatric/Behavioral: Negative.     All other systems reviewed and are negative.      Physical Exam  Vitals:    11/27/23 0813   BP: 119/80   Pulse: 86   Resp: 16   Temp: 37.3 C (99.2 F)   TempSrc: Oral   SpO2: 95%       Physical Exam  Vitals and nursing note reviewed.   Constitutional:       General: She is not in acute distress.     Appearance: Normal appearance. She is well-developed and normal weight. She is not ill-appearing, toxic-appearing or diaphoretic.   HENT:      Head: Normocephalic and atraumatic.  Right Ear: Tympanic membrane, ear canal and external ear normal. There is no impacted cerumen.      Left Ear: Tympanic membrane, ear canal and external ear normal. There is no impacted cerumen.      Nose: Congestion present. No rhinorrhea.      Comments: Mild nasal congestion without any active rhinorrhea.  No frontal or maxillary sinus tenderness bilaterally.     Mouth/Throat:      Mouth: Mucous membranes are moist.      Pharynx: Oropharynx is clear. Posterior oropharyngeal erythema present. No oropharyngeal exudate.      Comments: Moderately erythematous and edematous posterior pharynx and tonsils without any exudate.  Midline uvula without any evidence of uvular deviation or airway compromise.  Patient able to maintain oral secretions without any difficulty or drooling at the mouth.  Eyes:      General:         Right eye: No discharge.         Left eye: No discharge.      Extraocular Movements: Extraocular movements intact.      Conjunctiva/sclera: Conjunctivae normal.      Pupils:  Pupils are equal, round, and reactive to light.   Cardiovascular:      Rate and Rhythm: Normal rate and regular rhythm.      Pulses: Normal pulses.      Heart sounds: Normal heart sounds. No murmur heard.  Pulmonary:      Effort: Pulmonary effort is normal. No respiratory distress.      Breath sounds: Normal breath sounds. No stridor. No wheezing, rhonchi or rales.   Abdominal:      General: Abdomen is flat. Bowel sounds are normal.      Palpations: Abdomen is soft.      Tenderness: There is no abdominal tenderness. There is no right CVA tenderness or left CVA tenderness.   Musculoskeletal:         General: No swelling. Normal range of motion.      Cervical back: Normal range of motion and neck supple. No tenderness.      Right lower leg: No edema.      Left lower leg: No edema.   Lymphadenopathy:      Cervical: Cervical adenopathy (Anterior cervical lymphadenopathy present bilaterally.) present.   Skin:     General: Skin is warm and dry.      Capillary Refill: Capillary refill takes less than 2 seconds.      Coloration: Skin is not jaundiced.      Findings: No erythema, lesion or rash.   Neurological:      General: No focal deficit present.      Mental Status: She is alert and oriented to person, place, and time. Mental status is at baseline.      Gait: Gait normal.      Deep Tendon Reflexes: Reflexes normal.   Psychiatric:         Mood and Affect: Mood normal.              No orders to display     Labs Reviewed   POCT STREP A PCR - Abnormal       Result Value    POCT Strep A PCR Detected (*)    POCT SARS-COV-2/FLU A&B - Normal    POCT SARS-CoV-2 Not Detected      POCT Flu A Not Detected      POCT Flu B Not Detected      Narrative:  This test was performed with the [employer] [facility] SARS-COV-2 & Influenza A/B RNA by RT PCR molecular test. Not detected results do not rule out infection with COVID-19, Influenza A and/or Influenza B. This test is offered under Emergency use Authorization by the FDA.        Procedures  Procedures    UC Course  Diagnoses as of 11/27/23 0906   Strep pharyngitis       Medical Decision Making  Patient is positive for strep and was negative for COVID and flu.  Patient will be treated for her strep pharyngitis with a course of amoxicillin.  She is to throw away her toothbrush after 24 hours of the antibiotic use.  She is encouraged to push fluids, increase vitamin C, vitamin D, and zinc intake along with using a probiotic while on the antibiotic.  Patient is encouraged to follow-up with PCP within the next 2 to 3 days or return to the urgent care promptly for reevaluation with any new, unusual, or worsening symptoms.  She is stable, in no acute distress, verbalizes understanding, and is in agreement with treatment plan.        Problems Addressed:  Strep pharyngitis: complicated acute illness or injury    Risk  Prescription drug management.        Discharge Meds  ED Prescriptions       Medication Sig Dispense Start Date End Date Auth. Provider    amoxicillin (Amoxil) 500 mg tablet Take 1 tablet (500 mg) by mouth twice daily for 10 days. 20 tablet 11/27/2023 12/07/2023 Willaim Rayas, PA            Home Meds  Prior to Admission medications    Medication Sig Start Date End Date Taking? Authorizing Provider   buPROPion XL (Wellbutrin XL) 300 mg 24 hr tablet Take 300 mg by mouth once daily. 05/18/23  Yes Historical Provider, MD   prazosin (Minipress) 1 mg capsule Take 1 mg by mouth at bedtime. 01/29/23  Yes Nurse [facility], RN   sertraline (Zoloft) 50 mg tablet Take 50 mg by mouth in the morning. 07/16/21  Yes Nurse [facility], RN   traZODone (Desyrel) 150 mg tablet Take 150 mg by mouth if needed at bedtime. 06/03/21  Yes Historical Provider, MD   albuterol 90 mcg/actuation inhaler ProAir HFA 90 mcg/actuation aerosol inhaler  Patient not taking: Reported on 11/15/2023    Historical Provider, MD   benzonatate (Tessalon) 200 mg capsule benzonatate 200 mg capsule  Patient not taking: Reported  on 11/15/2023    Historical Provider, MD   clindamycin (Cleocin T) 1 % external solution in the morning. 06/07/21   Historical Provider, MD   Clomid 50 mg tablet Take by mouth.  Patient not taking: Reported on 11/15/2023 05/11/23   Historical Provider, MD   codeine-guaifenesin (Robitussin-AC) 10-100 mg/5 mL syrup 10 mL every 8 (eight) hours.  Patient not taking: Reported on 07/16/2022    Historical Provider, MD   codeine-guaifenesin (Robitussin-AC) 10-100 mg/5 mL syrup Take 10 mL by mouth if needed in the morning, at noon, and at bedtime.  Patient not taking: Reported on 07/16/2022 06/19/21   Historical Provider, MD   gabapentin (Neurontin) 300 mg capsule Take 1 capsule (300 mg) by mouth three times daily. 07/16/22 09/14/22  Sameer Kapasi, MD   ondansetron ODT (Zofran-ODT) 4 mg disintegrating tablet ondansetron 4 mg disintegrating tablet  Patient not taking: Reported on 11/15/2023    Historical Provider, MD  Patient encounter note may have been created using voice recognition software and in real time during the office visit. Please excuse any typographical errors that may not have been edited out.            Willaim Rayas, [place]  11/27/23 [phone]

## 2024-01-10 NOTE — Telephone Encounter (Signed)
 [facility]      Hi,      Patient called us  today request to speak to medical expertise regarding for concern (patient back is getting worse and surgery  happen  in February ) caller existing patient of Dr. Kristine Phalen /Dr. Gianoukus, They request for a call back.  Thank you   Linda Conley

## 2024-01-11 NOTE — Telephone Encounter (Signed)
 LVM and messaged over portal to discuss her concerns.

## 2024-01-14 MED ORDER — predniSONE (Deltasone) 20 mg tablet
20 | ORAL_TABLET | ORAL | 0 refills | 30.00000 days | Status: AC
Start: 2024-01-14 — End: 2024-01-20

## 2024-01-14 NOTE — Telephone Encounter (Signed)
 Informed RX sent prednisone to:    [id number] [place], [place] - [address]  [address] [place] [address]  Phone: [phone]  Fax: [phone]  DEA #: [id number]

## 2024-01-19 ENCOUNTER — Encounter: Payer: MEDICAID | Attending: Physical Medicine & Rehabilitation | Primary: Family Medicine

## 2024-04-28 ENCOUNTER — Inpatient Hospital Stay
Admit: 2024-04-28 | Disposition: A | Discharge status: Home / Self Care | Payer: MEDICAID | Arrived: VH | Attending: Physician Assistant | Primary: Family Medicine

## 2024-04-28 DIAGNOSIS — L301 Dyshidrosis [pompholyx]: Principal | ICD-10-CM

## 2024-04-28 LAB — POCT SARS-COV-2/FLU A&B
POCT Flu A: NOT DETECTED
POCT Flu B: NOT DETECTED
POCT SARS-CoV-2: NOT DETECTED

## 2024-04-28 MED ORDER — clobetasol (Temovate) 0.05 % ointment
0.05 | Freq: Two times a day (BID) | TOPICAL | 0 refills | 15.00000 days | Status: DC
Start: 2024-04-28 — End: 2024-04-30

## 2024-04-28 NOTE — Discharge Instructions (Addendum)
 Go to ED if having fever, shortness of breath, chest pain, nausea, vomiting, dizziness, headache worsen, numbness, tingling, weakness, joint pain worsen, neck pain, stiff neck, mental status changes, any concerns or symptoms worsen.

## 2024-04-28 NOTE — ED Provider Notes (Cosign Needed)
 [facility]  [address] [place] [address]     History  Chief Complaint   Patient presents with    Rash     Left hand rash which is very very itchy and joint pain 7/10 x 3 days    Joint Pain     See chief complaint and MDM  Review of system negative except as below in MDM      Problem List[1]    Past Medical History:   Diagnosis Date    Anxiety     Back pain     Cancer (Multi-HCC)         Past Surgical History:   Procedure Laterality Date    BREAST SURGERY N/A     COLON SURGERY          Family History[2]    Social History     Tobacco Use    Smoking status: Never    Smokeless tobacco: Never   Substance Use Topics    Alcohol use: Not Currently    Drug use: Never       Allergies   Allergen Reactions    Erythromycin Diarrhea and Nausea / Vomiting               Review of Systems    Physical Exam  Vitals:    04/28/24 1105   BP: 91/63   BP Location: Right arm   Patient Position: Sitting   Pulse: 97   Resp: 16   Temp: 36.7 C (98.1 F)   TempSrc: Oral   SpO2: 97%   Weight: 113.4 kg   Height: 1.651 m       Physical Exam  Vitals and nursing note reviewed.   Constitutional:       General: She is not in acute distress.     Appearance: Normal appearance. She is not ill-appearing, toxic-appearing or diaphoretic.   HENT:      Head: Atraumatic.      Right Ear: Tympanic membrane, ear canal and external ear normal.      Left Ear: Tympanic membrane, ear canal and external ear normal.      Nose: Nose normal.      Mouth/Throat:      Mouth: Mucous membranes are moist. No angioedema.      Pharynx: Oropharynx is clear. No pharyngeal swelling, oropharyngeal exudate, posterior oropharyngeal erythema or uvula swelling.   Eyes:      General: No scleral icterus.        Right eye: No discharge.         Left eye: No discharge.      Extraocular Movements: Extraocular movements intact.      Conjunctiva/sclera: Conjunctivae normal.      Pupils: Pupils are equal, round, and reactive to light.   Cardiovascular:       Rate and Rhythm: Normal rate and regular rhythm.   Pulmonary:      Effort: Pulmonary effort is normal.      Breath sounds: Normal breath sounds.   Musculoskeletal:         General: No swelling or tenderness. Normal range of motion.      Cervical back: Normal range of motion and neck supple.      Right lower leg: No edema.      Left lower leg: No edema.      Comments: No joints effusion, calor, tenderness, erythema, or decreased range of motion.  Strong handgrips.  Good strength bilaterally.  Skin:     General: Skin is warm and dry.      Findings: Rash present.      Comments: Pruritus 2 mm small deep vesicles tapioca  like in clusters on left palm and fingers.  No erythema, tenderness, swelling, calor, induration, fluctuance, bleeding, honey crust discharge, purulent discharge or red streaks.   Neurological:      General: No focal deficit present.      Mental Status: She is alert and oriented to person, place, and time.   Psychiatric:         Mood and Affect: Mood normal.              No orders to display     Labs Reviewed   POCT SARS-COV-2/FLU A&B - Normal       Result Value    POCT SARS-CoV-2 Not Detected      POCT Flu A Not Detected      POCT Flu B Not Detected      Narrative:     This test was performed with the [employer] [facility] SARS-COV-2 & Influenza A/B RNA by RT PCR molecular test. Not detected results do not rule out infection with COVID-19, Influenza A and/or Influenza B. This test is offered under Emergency use Authorization by the FDA.       Procedures  Procedures    Orders Placed This Encounter   Procedures    Lyme Disease Serology w/Reflex (SST)    Tick borne Disease by PCR ([facility]ia+HGE+HME+Lyme)    POCT SARS-COV-2/FLU A&B      No Medications were ordered during this encounter.        UC Course  Diagnoses as of 05/07/24 0934   Pain in joints   Dyshidrotic eczema       Medical Decision Making  Patient is a pleasant 43 years old female history anxiety, breast cancer, back pain, non-smoker, no drug use,  immunization up-to-date, presents to urgent care complaining of joints pain, feverish, body aches and very itchy rash on fingers and left palm for 3 days.  Patient works in her garden but unknown tick bite.  Patient just returned from vacation 4 days ago and used hotel's shampoo and soap during her vacation.  No itchy rash anywhere else.   Patient denies cough, nasal congestion, thunderclap headache, neck pain, stiff neck, visual changes, dizziness, new medications, new food, abdominal pain, nausea, vomiting, chest pain or shortness of breath.  Differential diagnosis: Viral illness, tickborne illness, flu COVID, dyshidrotic eczema, contact dermatitis.    Will check flu COVID test.  Today testing interpreted me: Negative flu, negative COVID-19 POC PCR.  Will check Lyme titer and tickborne illness.  Labs sent and pending.  No oral lesions.  Unlikely hand-foot-and-mouth disease.  Lungs clear to auscultate.  No evidence of cellulitis, abscess, or septic joints.  Patient's clinical presentation and exam are significant for dyshidrotic eczema, and joint pain  Will give patient clobetasol  0.05% ointment apply thin layer to affected area twice a day for 1 week, avoid face neck and genital area.  Counseling patient, rest, push fluids, symptomatic treatment, take over-the-counter medication for pain as needed, take nondrowsy antihistamine during the day and Benadryl as needed for itchiness at night for few days, close follow-up with your PCP, return to urgent care if any concerns, go to emergency department if any new symptoms or symptoms worsen.  Patient is afebrile and nontoxic-appearing.  Hemodynamic stable.  Patient looks well to go home.  Discharged from urgent  care with all question answered and red flag discussed.  Patient verbalized understanding and comfortable discharge plan.  Stable for discharge    Problems Addressed:  Dyshidrotic eczema: complicated acute illness or injury  Pain in joints: complicated acute  illness or injury    Amount and/or Complexity of Data Reviewed  Labs: ordered.    Risk  OTC drugs.  Prescription drug management.        Discharge Meds  ED Prescriptions       Medication Sig Dispense Start Date End Date Auth. Provider    clobetasol  (Temovate ) 0.05 % ointment (Expired) Apply topically twice daily for 7 days. Apply thin layer to rash on your hand.  Avoid face, neck or genital area 15 g 04/28/2024 04/30/2024 Catarina KATHEE Camp, PA            Home Meds  Prior to Admission medications   Medication Sig Start Date End Date Taking? Authorizing Provider   zolpidem CR (Ambien CR) 12.5 mg ER tablet Take 12.5 mg by mouth 1 (one) time each day at the same time. 03/09/23  Yes Nurse [facility], RN   albuterol 90 mcg/actuation inhaler ProAir HFA 90 mcg/actuation aerosol inhaler  Patient not taking: Reported on 11/15/2023    Historical Provider, MD   benzonatate (Tessalon) 200 mg capsule benzonatate 200 mg capsule  Patient not taking: Reported on 11/15/2023    Historical Provider, MD   buPROPion XL (Wellbutrin XL) 300 mg 24 hr tablet Take 300 mg by mouth once daily. 05/18/23   Historical Provider, MD   clindamycin (Cleocin T) 1 % external solution in the morning. 06/07/21   Historical Provider, MD   clobetasol  (Temovate ) 0.05 % ointment Apply topically twice daily for 7 days. Apply thin layer to rash on your hand.  Avoid face, neck or genital area 04/28/24 05/05/24  Catarina KATHEE Camp, PA   Clomid 50 mg tablet Take by mouth.  Patient not taking: Reported on 11/15/2023 05/11/23   Historical Provider, MD   codeine-guaifenesin (Robitussin-AC) 10-100 mg/5 mL syrup 10 mL every 8 (eight) hours.    Historical Provider, MD   codeine-guaifenesin (Robitussin-AC) 10-100 mg/5 mL syrup Take 10 mL by mouth if needed in the morning, at noon, and at bedtime.  Patient not taking: Reported on 07/16/2022 06/19/21   Historical Provider, MD   gabapentin  (Neurontin ) 300 mg capsule Take 1 capsule (300 mg) by mouth three times daily. 07/16/22 09/14/22  Sameer  Kapasi, MD   ondansetron ODT (Zofran-ODT) 4 mg disintegrating tablet ondansetron 4 mg disintegrating tablet  Patient not taking: Reported on 11/15/2023    Historical Provider, MD   prazosin (Minipress) 1 mg capsule Take 1 mg by mouth at bedtime. 01/29/23   Nurse [facility], RN   sertraline (Zoloft) 50 mg tablet Take 50 mg by mouth in the morning. 07/16/21   Nurse [facility], RN   traZODone (Desyrel) 150 mg tablet Take 150 mg by mouth if needed at bedtime. 06/03/21   Historical Provider, MD       Patient encounter note may have been created using voice recognition software and in real time during the office visit. Please excuse any typographical errors that may not have been edited out.                [1]   Patient Active Problem List  Diagnosis    Lumbar radiculopathy    Sprain of medial collateral ligament of left knee    L-S radiculopathy    Abnormal  MRI, lumbar spine    Vertebrogenic low back pain   [2]   Family History  Problem Relation Name Age of Onset    Lumbar disc disease Mother      Lumbar disc disease Father          Catarina KATHEE Camp, [place]  05/07/24 [phone]

## 2024-04-29 LAB — LYME DISEASE SEROLOGY W/REFLEX: Lyme Total Antibody CIA: NEGATIVE

## 2024-04-30 ENCOUNTER — Inpatient Hospital Stay
Admit: 2024-04-30 | Disposition: A | Discharge status: Home / Self Care | Payer: MEDICAID | Arrived: VH | Attending: Undersea and Hyperbaric Medicine | Primary: Family Medicine

## 2024-04-30 DIAGNOSIS — R21 Rash and other nonspecific skin eruption: Principal | ICD-10-CM

## 2024-04-30 MED ORDER — cetirizine (ZyrTEC) 10 mg tablet
10 | ORAL_TABLET | Freq: Every day | ORAL | 0 refills | 90.00000 days | Status: AC
Start: 2024-04-30 — End: 2024-05-10

## 2024-04-30 MED ORDER — dexAMETHasone (Decadron) 4 mg tablet
4 | ORAL_TABLET | ORAL | 0 refills | 12.00000 days | Status: DC
Start: 2024-04-30 — End: 2024-05-08

## 2024-04-30 NOTE — Discharge Instructions (Addendum)
 Stop the previously prescribed clobetasol /steroid cream for the arm rash and start the steroid by mouth as directed.  Plus the Zyrtec  as needed histamine to decrease itching.  Zyrtec  will be taken once a day.  You can use Benadryl at night if needed to help you sleep with the itching.  You can also use oatmeal bath.  Please call primary care physician to schedule follow-up  The office opens.  As discussed rash may last 2 weeks.

## 2024-04-30 NOTE — ED Provider Notes (Signed)
 [facility]  [address] [place] [address]     History  Chief Complaint   Patient presents with    Rash     HPI  43 year old female returns to urgent care.  History of anxiety, cancer and below.  Seen at urgent care April 28, 2024 complaining of left hand rash that was very itchy along with joint pain for 3 days.  Patient was diagnosed with dyshidrotic eczema was put on clobetasol  twice a day for 7 days was advised to call or follow-up with primary care physician as soon as possible.  Returns to urgent care this afternoon.  Patient felt generalized itchy body rash yesterday evening.  Does not take any medication.  No new or change medications other than the clobetasol  that was prescribed for the dyshidrotic eczema.  Patient states that she recently returned from travel to [place] ago 6 days ago.  Was using hotel with soap and shampoo.  No one else in the family has any similar symptoms denies any other new soaps creams lotions detergents other than the above while in the hotel.  It is noted patient is actually been home for 6 days with no symptoms until yesterday evening.    Problem List[1]    Past Medical History:   Diagnosis Date    Anxiety     Back pain     Cancer (Multi-HCC)         Past Surgical History:   Procedure Laterality Date    BREAST SURGERY N/A     COLON SURGERY          Family History[2]    Social History     Tobacco Use    Smoking status: Never    Smokeless tobacco: Never   Substance Use Topics    Alcohol use: Not Currently    Drug use: Never       Allergies   Allergen Reactions    Erythromycin Diarrhea and Nausea / Vomiting               Review of Systems   Constitutional:  Negative for chills and fever.   HENT:  Negative for congestion and sore throat.    Eyes:  Negative for photophobia, pain and visual disturbance.   Respiratory:  Negative for cough, chest tightness and shortness of breath.    Cardiovascular:  Negative for chest pain and palpitations.    Gastrointestinal:  Negative for abdominal pain, diarrhea, nausea and vomiting.   Genitourinary:  Negative for dysuria and hematuria.   Musculoskeletal:  Negative for arthralgias, back pain, myalgias, neck pain and neck stiffness.   Skin:  Positive for rash. Negative for color change.   Neurological:  Negative for dizziness, seizures, syncope and headaches.   All other systems reviewed and are negative.      Physical Exam  Vitals:    04/30/24 1230 04/30/24 1232   BP:  94/62   Pulse:  66   Resp:  16   Temp:  36.6 C (97.8 F)   SpO2:  97%   Weight: 113.4 kg    Height: 1.651 m        Physical Exam  Vitals and nursing note reviewed.   Constitutional:       General: She is not in acute distress.     Appearance: Normal appearance. She is well-developed. She is not ill-appearing.      Comments: Sitting on exam chair, no acute distress, armband checked   HENT:  Head: Normocephalic and atraumatic.      Mouth/Throat:      Mouth: Mucous membranes are moist.      Pharynx: Oropharynx is clear.   Eyes:      Extraocular Movements: Extraocular movements intact.      Conjunctiva/sclera: Conjunctivae normal.      Pupils: Pupils are equal, round, and reactive to light.   Cardiovascular:      Rate and Rhythm: Normal rate and regular rhythm.      Pulses: Normal pulses.      Heart sounds: Normal heart sounds. No murmur heard.  Pulmonary:      Effort: Pulmonary effort is normal. No respiratory distress.      Breath sounds: Normal breath sounds.   Abdominal:      General: Bowel sounds are normal.      Palpations: Abdomen is soft. There is no mass.      Tenderness: There is no abdominal tenderness. There is no right CVA tenderness, left CVA tenderness, guarding or rebound.   Musculoskeletal:         General: No swelling. Normal range of motion.      Cervical back: Normal range of motion and neck supple.   Skin:     General: Skin is warm and dry.      Capillary Refill: Capillary refill takes less than 2 seconds.      Findings: Rash  present.      Comments: Diffuse nondescript maculopapular rash of 3-4 mm scattered over the extremities torso neck.   Neurological:      General: No focal deficit present.      Mental Status: She is alert and oriented to person, place, and time.      Cranial Nerves: No cranial nerve deficit.      Sensory: No sensory deficit.      Motor: No weakness.      Gait: Gait normal.   Psychiatric:         Mood and Affect: Mood normal.         Behavior: Behavior normal.         Thought Content: Thought content normal.              No orders to display     Labs Reviewed - No data to display    Procedures  Procedures    No Orders were placed on this patient.      Medications Ordered This Encounter   Medications    cetirizine  (ZyrTEC ) 10 mg tablet    dexAMETHasone  (Decadron ) 4 mg tablet        [facility] Course  Diagnoses as of 04/30/24 1305   Rash       Medical Decision Making  43 year old female with nondescript rash.  Advised to stop the clobetasol .  I prescribed a tapered course of Decadron  over approximately 8 days, Zyrtec  during the day Benadryl at night.  Advised to use oatmeal bath soap.  Symptoms should improve however patient was made aware of rash sometimes can last 2 weeks.  Advised to call primary care the next time the office opens.    Problems Addressed:  Rash: complicated acute illness or injury    Risk  OTC drugs.  Prescription drug management.        Discharge Meds  ED Prescriptions       Medication Sig Dispense Start Date End Date Auth. Provider    dexAMETHasone  (Decadron ) 4 mg tablet 2 po qd x 4 days, then 1 po  qd x 4 days 12 tablet 04/30/2024 -- Lonni Sachs, MD    cetirizine  (ZyrTEC ) 10 mg tablet Take 1 tablet (10 mg) by mouth once daily for 10 days. 10 tablet 04/30/2024 05/10/2024 Lonni Sachs, MD            Home Meds  Prior to Admission medications   Medication Sig Start Date End Date Taking? Authorizing Provider   buPROPion XL (Wellbutrin XL) 300 mg 24 hr tablet Take 300 mg by mouth in the morning.  05/18/23  Yes Historical Provider, MD   clobetasol  (Temovate ) 0.05 % ointment Apply topically twice daily for 7 days. Apply thin layer to rash on your hand.  Avoid face, neck or genital area 04/28/24 05/05/24 Yes Catarina KATHEE Camp, PA   sertraline (Zoloft) 50 mg tablet Take 50 mg by mouth in the morning. 07/16/21  Yes Nurse [facility], RN   traZODone (Desyrel) 150 mg tablet Take 150 mg by mouth if needed at bedtime. 06/03/21  Yes Historical Provider, MD   zolpidem CR (Ambien CR) 12.5 mg ER tablet Take 12.5 mg by mouth 1 (one) time each day at the same time. 03/09/23  Yes Nurse [facility], RN   albuterol 90 mcg/actuation inhaler ProAir HFA 90 mcg/actuation aerosol inhaler  Patient not taking: Reported on 04/30/2024    Historical Provider, MD   benzonatate (Tessalon) 200 mg capsule benzonatate 200 mg capsule  Patient not taking: Reported on 04/30/2024    Historical Provider, MD   clindamycin (Cleocin T) 1 % external solution in the morning.  Patient not taking: Reported on 04/30/2024 06/07/21   Historical Provider, MD   Clomid 50 mg tablet Take by mouth.  Patient not taking: Reported on 04/30/2024 05/11/23   Historical Provider, MD   codeine-guaifenesin (Robitussin-AC) 10-100 mg/5 mL syrup 10 mL every 8 (eight) hours.    Historical Provider, MD   codeine-guaifenesin (Robitussin-AC) 10-100 mg/5 mL syrup Take 10 mL by mouth if needed in the morning, at noon, and at bedtime.  Patient not taking: Reported on 04/30/2024 06/19/21   Historical Provider, MD   gabapentin  (Neurontin ) 300 mg capsule Take 1 capsule (300 mg) by mouth three times daily. 07/16/22 09/14/22  Sameer Kapasi, MD   ondansetron ODT (Zofran-ODT) 4 mg disintegrating tablet ondansetron 4 mg disintegrating tablet  Patient not taking: Reported on 04/30/2024    Historical Provider, MD   prazosin (Minipress) 1 mg capsule Take 1 mg by mouth at bedtime.  Patient not taking: Reported on 04/30/2024 01/29/23   Nurse [facility], RN       Patient encounter note may have been  created using voice recognition software and in real time during the office visit. Please excuse any typographical errors that may not have been edited out.              [1]   Patient Active Problem List  Diagnosis    Lumbar radiculopathy    Sprain of medial collateral ligament of left knee    L-S radiculopathy    Abnormal MRI, lumbar spine    Vertebrogenic low back pain   [2]   Family History  Problem Relation Name Age of Onset    Lumbar disc disease Mother      Lumbar disc disease Father          Lonni Sachs, MD  04/30/24 [phone]

## 2024-05-03 LAB — TICK BORNE DISEASE BY PCR
A phagocytoph PCR Bld: NEGATIVE
Babesia sp. DNA, PCR: NEGATIVE
E chaffeensis PCR Bld: NEGATIVE
Lyme PCR: NEGATIVE

## 2024-05-08 ENCOUNTER — Inpatient Hospital Stay
Admit: 2024-05-08 | Disposition: A | Discharge status: Home / Self Care | Payer: MEDICAID | Arrived: VH | Attending: Undersea and Hyperbaric Medicine | Primary: Family Medicine

## 2024-05-08 DIAGNOSIS — U071 COVID-19: Principal | ICD-10-CM

## 2024-05-08 LAB — POCT STREP A PCR: POCT Strep A PCR: NOT DETECTED

## 2024-05-08 LAB — POCT SARS-COV-2/FLU A&B
POCT Flu A: NOT DETECTED
POCT Flu B: NOT DETECTED
POCT SARS-CoV-2: DETECTED — AB

## 2024-05-08 MED ORDER — hydrocodone-chlorpheniramine (Tussionex Pennkinetic) 10-8 mg/5 mL ER suspension
10-8 | Freq: Two times a day (BID) | ORAL | 0 refills | Status: AC | PRN
Start: 2024-05-08 — End: 2024-05-13

## 2024-05-08 NOTE — Discharge Instructions (Addendum)
 Call primary care today when you get home to update them.  As discussed you can also use other cough and cold medications including, but not limited to Robitussin, Mucinex, Delsym, etc.  Drink plenty fluids, get plenty of rest.  You can also use other over-the-counter cough and cold medication including but not limited to DayQuil, NyQuil, TheraFlu, etc.

## 2024-05-08 NOTE — ED Provider Notes (Signed)
 [facility] [place]  [address] [place] [address]     History  Chief Complaint   Patient presents with    Cough     Pt co cough, sore throat, head ache, fever. Symptoms started Saturday.      HPI  43 year old female returns to urgent care for her third visit in less than 2 weeks.  Seen twice for rash last visit April 30, 2024 returns today.  Patient is not sure to call follow-up from her last 2 visits to urgent care.  Presents today with cough, runny nose itchy ears sore throat.  Patient states husband's been sick for the past 3-4 days.  Patient started yesterday with mild cough sore throat myalgias, headache question fever though no temperature taken.  Tried Robitussin over-the-counter once without improvement, states that when she gets these episodes the only thing that works for her is Robitussin with codeine.  Patient denies smoking.  Other history includes anxiety and below.    Problem List[1]    Past Medical History:   Diagnosis Date    Anxiety     Back pain     Cancer (Multi-HCC)         Past Surgical History:   Procedure Laterality Date    BREAST SURGERY N/A     COLON SURGERY          Family History[2]    Social History     Tobacco Use    Smoking status: Never    Smokeless tobacco: Never   Substance Use Topics    Alcohol use: Not Currently    Drug use: Never       Allergies   Allergen Reactions    Erythromycin Diarrhea and Nausea / Vomiting               Review of Systems   Constitutional:  Positive for fever. Negative for chills.   HENT:  Positive for congestion, ear pain, postnasal drip, rhinorrhea and sore throat.    Eyes:  Negative for pain and visual disturbance.   Respiratory:  Positive for cough. Negative for chest tightness and shortness of breath.    Cardiovascular:  Negative for chest pain and palpitations.   Gastrointestinal:  Negative for abdominal pain and vomiting.   Genitourinary:  Negative for dysuria and hematuria.   Musculoskeletal:  Positive for myalgias.  Negative for arthralgias and back pain.   Skin:  Negative for color change and rash.   Neurological:  Positive for headaches. Negative for seizures and syncope.   All other systems reviewed and are negative.      Physical Exam  Vitals:    05/08/24 0813   BP: 91/61   BP Location: Left arm   Patient Position: Sitting   Pulse: 93   Resp: 18   Temp: 37.1 C (98.7 F)   TempSrc: Oral   SpO2: 95%       Physical Exam  Vitals and nursing note reviewed.   Constitutional:       General: She is not in acute distress.     Appearance: Normal appearance. She is well-developed. She is not ill-appearing.      Comments: Sitting on exam chair, no acute distress, armband checked   HENT:      Head: Normocephalic and atraumatic.      Right Ear: Tympanic membrane, ear canal and external ear normal.      Left Ear: Tympanic membrane, ear canal and external ear normal.  Mouth/Throat:      Mouth: Mucous membranes are moist.      Pharynx: Oropharynx is clear.   Eyes:      Extraocular Movements: Extraocular movements intact.      Conjunctiva/sclera: Conjunctivae normal.      Pupils: Pupils are equal, round, and reactive to light.   Cardiovascular:      Rate and Rhythm: Normal rate and regular rhythm.      Pulses: Normal pulses.      Heart sounds: Normal heart sounds. No murmur heard.  Pulmonary:      Effort: Pulmonary effort is normal. No respiratory distress.      Breath sounds: Normal breath sounds.   Musculoskeletal:         General: No swelling. Normal range of motion.      Cervical back: Normal range of motion and neck supple.   Skin:     General: Skin is warm and dry.      Capillary Refill: Capillary refill takes less than 2 seconds.   Neurological:      General: No focal deficit present.      Mental Status: She is alert and oriented to person, place, and time.      Cranial Nerves: No cranial nerve deficit.      Sensory: No sensory deficit.      Motor: No weakness.      Gait: Gait normal.   Psychiatric:         Mood and Affect: Mood  normal.         Behavior: Behavior normal.         Thought Content: Thought content normal.              No orders to display     Labs Reviewed   POCT SARS-COV-2/FLU A&B - Abnormal       Result Value    POCT SARS-CoV-2 Detected (*)     POCT Flu A Not Detected      POCT Flu B Not Detected      Narrative:     This test was performed with the [employer] [facility] SARS-COV-2 & Influenza A/B RNA by RT PCR molecular test. Not detected results do not rule out infection with COVID-19, Influenza A and/or Influenza B. This test is offered under Emergency use Authorization by the FDA.   POCT STREP A PCR - Normal    POCT Strep A PCR Not Detected         Procedures  Procedures    Orders Placed This Encounter   Procedures    POCT Strep A PCR    POCT SARS-COV-2/FLU A&B      Medications Ordered This Encounter   Medications    hydrocodone -chlorpheniramine  (Tussionex Pennkinetic ) 10-8 mg/5 mL ER suspension        [facility] Course  ED Course as of 05/08/24 0854   [facility] May 08, 2024   0842 Patient updated regarding positive COVID test         Diagnoses as of 05/08/24 0854   COVID-19 virus RNA test result positive at limit of detection       Medical Decision Making  Labs reviewed by this physician positive COVID, negative flu and strep.  43 year old female third visit in less than 2 weeks.  Has not tried to call to be seen by primary care.  COVID-positive today.  Have prescribed Tussionex which patient states is the only thing that really works for her.  I have also advised symptoms may  last another 1 to 2 weeks.  Call primary care today to schedule follow-up.  Let anyone know she has been around the past few days that she is COVID-positive.  Any worsening symptoms or concerns she can always go to the emergency department.  See AVS    Problems Addressed:  COVID-19 virus RNA test result positive at limit of detection: complicated acute illness or injury    Risk  Prescription drug management.        Discharge Meds  ED Prescriptions       Medication Sig  Dispense Start Date End Date Auth. Provider    hydrocodone -chlorpheniramine  (Tussionex Pennkinetic ) 10-8 mg/5 mL ER suspension Take 5 mL by mouth every 12 (twelve) hours if needed for cough for up to 5 days. 50 mL 05/08/2024 05/13/2024 Lonni Sachs, MD            Home Meds  Prior to Admission medications   Medication Sig Start Date End Date Taking? Authorizing Provider   buPROPion XL (Wellbutrin XL) 300 mg 24 hr tablet Take 300 mg by mouth in the morning. 05/18/23  Yes Historical Provider, MD   cetirizine  (ZyrTEC ) 10 mg tablet Take 1 tablet (10 mg) by mouth once daily for 10 days. 04/30/24 05/10/24 Yes Lonni Sachs, MD   sertraline (Zoloft) 50 mg tablet Take 50 mg by mouth in the morning. 07/16/21  Yes Nurse [facility], RN   traZODone (Desyrel) 150 mg tablet Take 150 mg by mouth if needed at bedtime. 06/03/21  Yes Historical Provider, MD   zolpidem CR (Ambien CR) 12.5 mg ER tablet Take 12.5 mg by mouth 1 (one) time each day at the same time. 03/09/23  Yes Nurse [facility], RN   Clomid 50 mg tablet Take by mouth.  Patient not taking: Reported on 04/30/2024 05/11/23   Historical Provider, MD   dexAMETHasone  (Decadron ) 4 mg tablet 2 po qd x 4 days, then 1 po qd x 4 days  Patient not taking: Reported on 05/08/2024 04/30/24   Lonni Sachs, MD   gabapentin  (Neurontin ) 300 mg capsule Take 1 capsule (300 mg) by mouth three times daily. 07/16/22 09/14/22  Lucendia Knows, MD   prazosin (Minipress) 1 mg capsule Take 1 mg by mouth at bedtime.  Patient not taking: Reported on 04/30/2024 01/29/23   Nurse [facility], RN       Patient encounter note may have been created using voice recognition software and in real time during the office visit. Please excuse any typographical errors that may not have been edited out.              [1]   Patient Active Problem List  Diagnosis    Lumbar radiculopathy    Sprain of medial collateral ligament of left knee    L-S radiculopathy    Abnormal MRI, lumbar spine     Vertebrogenic low back pain   [2]   Family History  Problem Relation Name Age of Onset    Lumbar disc disease Mother      Lumbar disc disease Father          Lonni Sachs, MD  05/08/24 [phone]

## 2024-05-22 ENCOUNTER — Inpatient Hospital Stay
Admit: 2024-05-22 | Disposition: A | Discharge status: Home / Self Care | Payer: MEDICAID | Arrived: VH | Attending: Physician Assistant | Primary: Family Medicine

## 2024-05-22 DIAGNOSIS — N9489 Other specified conditions associated with female genital organs and menstrual cycle: Secondary | ICD-10-CM

## 2024-05-22 LAB — POCT URINALYSIS DIPSTICK
Bilirubin, UA, POC: NEGATIVE
Blood, UA, POC: NEGATIVE
Glucose, UA, POC: NEGATIVE
Ketones, UA, POC: NEGATIVE
Leukocytes, UA, POC: NEGATIVE
Nitrite, UA, POC: NEGATIVE
Protein, UA, POC: NEGATIVE
Spec Gravity, UA, POC: 1.015 (ref 1.001–1.035)
Urobilinogen, UA, POC: NORMAL
pH, UA, POC: 5 (ref 5.0–8.0)

## 2024-05-22 LAB — POCT PREGNANCY, URINE: POC hCG Qual, Ur: NEGATIVE

## 2024-05-22 MED ORDER — fluconazole (Diflucan) 150 mg tablet
150 | ORAL_TABLET | ORAL | 0 refills | 10.00000 days | Status: AC
Start: 2024-05-22 — End: ?

## 2024-05-22 MED ORDER — hydrocortisone (Anusol-HC) 2.5 % rectal cream
2.5 | Freq: Two times a day (BID) | TOPICAL | 0 refills | Status: AC
Start: 2024-05-22 — End: 2024-05-29

## 2024-05-22 NOTE — ED Provider Notes (Signed)
 [facility]  [address] [place] [address]     History  Chief Complaint   Patient presents with    Pain     Pt c/o itchy rectum, and lump for 1 week. Pt had rectal bleeding 1 month ago but the bleeding stopped. 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  Patient reports symptoms of rectal itching for the past week.  She feels a lump along her rectal region for the past week as well.  She does report history of rectal bleeding approximately 1 month ago although bleeding has resolved.  She notes blood was pooled in the toilet bowl at the time.  She denies any constipation.  She does note that she does strain when going to the bathroom, and does sit for long periods of time.  Patient also reports vaginal itching.  She denies any vaginal discharge.  She does report burning with urination and burning along vaginal region in general.  She does report history of infections, no history of UTIs.  Denies any UTI symptoms including urinary frequency, urgency, suprapubic pain, nausea, vomiting, flank pain, fevers, chills.      Problem List[1]    Past Medical History:   Diagnosis Date    Anxiety     Back pain     Cancer (Multi-HCC)         Past Surgical History:   Procedure Laterality Date    BREAST SURGERY N/A     COLON SURGERY          Family History[2]    Social History     Tobacco Use    Smoking status: Never    Smokeless tobacco: Never   Substance Use Topics    Alcohol use: Not Currently    Drug use: Never       Allergies   Allergen Reactions    Erythromycin Diarrhea and Nausea / Vomiting               Review of Systems   Constitutional:  Negative for chills and  fever.   Respiratory:  Negative for shortness of breath.    Cardiovascular:  Negative for chest pain.   Gastrointestinal:  Positive for anal bleeding. Negative for abdominal pain, nausea and vomiting.        Rectal pain, and rectal lump   Genitourinary:  Positive for vaginal pain (vaginal burning). Negative for dysuria, flank pain, frequency, hematuria, urgency, vaginal bleeding and vaginal discharge.   Musculoskeletal:  Negative for arthralgias and back pain.   Skin:  Negative for rash.   All other systems reviewed and are negative.      Physical Exam  Vitals:    05/22/24 1027   BP: 124/82   BP Location: Left arm   Pulse: 68   Resp: 18   Temp: 36.6 C (97.8 F)   TempSrc: Oral   SpO2: 99%   Weight: 113.4 kg   Height: 1.651 m       Physical Exam  Vitals and nursing note reviewed.   Constitutional:       General: She is not in acute distress.  Appearance: Normal appearance. She is well-developed. She is obese. She is not ill-appearing or toxic-appearing.   HENT:      Head: Normocephalic and atraumatic.      Right Ear: External ear normal.      Left Ear: External ear normal.   Eyes:      Conjunctiva/sclera: Conjunctivae normal.   Pulmonary:      Effort: Pulmonary effort is normal. No respiratory distress.   Abdominal:      Palpations: Abdomen is soft.      Tenderness: There is no abdominal tenderness. There is no right CVA tenderness, left CVA tenderness, guarding or rebound.   Genitourinary:     Vagina: Erythema (bilateral labia majora and minora with mild to moderate erythema) present.      Rectum: Tenderness and external hemorrhoid present. No anal fissure.      Comments: There are 2 large rectal external hemorrhoids, nonthrombosed, one along 12 o'clock position and the other along 6 o'clock position, tender to touch.  Musculoskeletal:         General: Normal range of motion.      Cervical back: Neck supple.   Skin:     General: Skin is warm and dry.   Neurological:      General: No focal deficit present.       Mental Status: She is alert. Mental status is at baseline.              No orders to display     Labs Reviewed   POCT URINALYSIS DIPSTICK - Normal       Result Value    Color, UA, POC Yellow      Clarity, UA, POC Clear      Spec Gravity, UA, POC 1.015      pH, UA, POC 5.0      Leukocytes, UA, POC Negative      Nitrite, UA, POC Negative      Protein, UA, POC Negative      Glucose, UA, POC Negative      Ketones, UA, POC Negative      Urobilinogen, UA, POC Normal      Bilirubin, UA, POC Negative      Blood, UA, POC Negative     POCT PREGNANCY, URINE - Normal    POC hCG Qual, Ur Negative      Internal QC Pass      Narrative:     This test is designed to confirm pregnancy following the first day of the missed menstrual period in early, presumptively normal pregnancies.  False negative results are possible prior to the first missed menstrual period, in mid to late pregnancy, in ectopic pregnancies, or with dilute urine specimens.   For definitive diagnosis, quantitative serum hCG is recommended.     URINE CULTURE       Procedures  Procedures    Orders Placed This Encounter   Procedures    Urine culture    POC Urinalysis Dipstick    POC Urine Pregnancy      Medications Ordered This Encounter   Medications    fluconazole  (Diflucan ) 150 mg tablet    hydrocortisone  (Anusol -HC) 2.5 % rectal cream        UC Course  Diagnoses as of 05/22/24 1105   Vaginal burning   External hemorrhoids   Vulvovaginal candidiasis   Burning with urination       Medical Decision Making  1.  External nonthrombosed hemorrhoids -patient will be started on Anusol  cream  to use as directed.  Recommended that she start Epsom salt soaks to area daily.  Avoid constipation, exercise, increase hydration, increase fiber in diet.  Follow-up with PCP in 7 to 10 days if symptoms persist, sooner if any changes.  2.  Vulvovaginal candidiasis -treat with Diflucan  as directed.  UA dipstick negative for infection.  Urine hCG negative.  Will send urine culture due to  symptoms of burning with urination although likely secondary to candidiasis of vagina.    Problems Addressed:  Burning with urination: complicated acute illness or injury  External hemorrhoids: complicated acute illness or injury  Vaginal burning: complicated acute illness or injury  Vulvovaginal candidiasis: complicated acute illness or injury    Amount and/or Complexity of Data Reviewed  Labs: ordered.    Risk  Prescription drug management.        Discharge Meds  ED Prescriptions       Medication Sig Dispense Start Date End Date Auth. Provider    fluconazole  (Diflucan ) 150 mg tablet Take 1 tab today and repeat dose in 72 hours if symptoms persist 2 tablet 05/22/2024 -- Sherrilynn Gudgel, PA    hydrocortisone  (Anusol -HC) 2.5 % rectal cream Insert 1 Application into the rectum twice daily for 7 days. Apply rectally 2 times daily 28 g 05/22/2024 05/29/2024 Skylyn Slezak, PA            Home Meds  Prior to Admission medications   Medication Sig Start Date End Date Taking? Authorizing Provider   buPROPion XL (Wellbutrin XL) 300 mg 24 hr tablet Take 300 mg by mouth in the morning. 05/18/23  Yes Historical Provider, MD   Clomid 50 mg tablet Take by mouth. 05/11/23  Yes Historical Provider, MD   letrozole (Femara) 2.5 mg tablet Take by mouth once daily Take with or without food.   Yes Nurse [facility], RN   sertraline (Zoloft) 50 mg tablet Take 50 mg by mouth in the morning. 07/16/21  Yes Nurse [facility], RN   traZODone (Desyrel) 150 mg tablet Take 150 mg by mouth if needed at bedtime. 06/03/21  Yes Historical Provider, MD   zolpidem CR (Ambien CR) 12.5 mg ER tablet Take 12.5 mg by mouth 1 (one) time each day at the same time. 03/09/23  Yes Nurse [facility], RN   cetirizine  (ZyrTEC ) 10 mg tablet Take 1 tablet (10 mg) by mouth once daily for 10 days. 04/30/24 05/10/24  Lonni Sachs, MD   fluconazole  (Diflucan ) 150 mg tablet Take 1 tab today and repeat dose in 72 hours if symptoms persist 05/22/24   Faelynn Wynder, PA    gabapentin  (Neurontin ) 300 mg capsule Take 1 capsule (300 mg) by mouth three times daily. 07/16/22 09/14/22  Lucendia Knows, MD   hydrocortisone  (Anusol -HC) 2.5 % rectal cream Insert 1 Application into the rectum twice daily for 7 days. Apply rectally 2 times daily 05/22/24 05/29/24  Itzayana Pardy, PA   prazosin (Minipress) 1 mg capsule Take 1 mg by mouth at bedtime.  Patient not taking: Reported on 04/30/2024 01/29/23   Nurse [facility], RN       Patient encounter note may have been created using voice recognition software and in real time during the office visit. Please excuse any typographical errors that may not have been edited out.              [1]   Patient Active Problem List  Diagnosis    Lumbar radiculopathy    Sprain of medial collateral ligament of  left knee    L-S radiculopathy    Abnormal MRI, lumbar spine    Vertebrogenic low back pain    Vaginal burning   [2]   Family History  Problem Relation Name Age of Onset    Lumbar disc disease Mother      Lumbar disc disease Father          Marjie Chea  [place], [place]  05/22/24 1105

## 2024-05-24 LAB — URINE CULTURE

## 2024-05-31 ENCOUNTER — Inpatient Hospital Stay: Arrived: VH | Primary: Family Medicine

## 2024-07-03 NOTE — Telephone Encounter (Signed)
 Incoming call from pt requesting a refill on Steroid pack she was prescribed. Patient states she is in a lot of pain.

## 2024-07-12 ENCOUNTER — Ambulatory Visit
Admit: 2024-07-12 | Discharge: 2024-07-12 | Payer: MEDICAID | Attending: Physical Medicine & Rehabilitation | Primary: Family Medicine

## 2024-07-12 NOTE — Assessment & Plan Note (Signed)
 Considering the patient's ongoing pain that has been refractory to conservative  management including physical therapy/supervised home exercise program and NSAIDs/prescribed oral medications for at least 6 weeks during the last 6 months and is appreciable in an examination that reveals neurologic deficits that parallel the patient's MRI findings, it is medically necessary to order: Left paracentral L4-5 interlaminar epidural steroid injection.    Risks and benefits were explained.  All questions were answered.   The consent form will be signed on the procedure day.
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
## 2024-07-26 ENCOUNTER — Ambulatory Visit: Payer: MEDICAID | Attending: Surgery | Primary: Family Medicine

## 2024-08-10 ENCOUNTER — Ambulatory Visit: Payer: MEDICAID | Attending: Surgery | Primary: Family Medicine

## 2024-08-22 NOTE — Telephone Encounter (Signed)
 Called patient and left VM for call back to confirm 12/30 appt. Helmut will not be here on 12/31.

## 2024-08-28 NOTE — Telephone Encounter (Signed)
 All set, spoke with patient.

## 2024-08-28 NOTE — Telephone Encounter (Signed)
 Patient is calling to her reschedule her appointment with Helmut tomorrow 12/30 at 10:45am.     [phone]    Thank you!

## 2024-08-29 ENCOUNTER — Encounter: Payer: MEDICAID | Attending: Surgery | Primary: Family Medicine

## 2024-08-30 ENCOUNTER — Ambulatory Visit: Payer: MEDICAID | Attending: Surgery | Primary: Family Medicine

## 2024-09-04 NOTE — Telephone Encounter (Signed)
 I spoke with Linda Conley. She no longer has [name] since the beginning of the year. She will call back with her new insurance. I asked her to please call before her 09/21/24 appointment with Dr. Helmut.

## 2024-09-05 ENCOUNTER — Encounter: Payer: MEDICAID | Attending: Physical Medicine & Rehabilitation | Primary: Family Medicine

## 2024-09-14 NOTE — Telephone Encounter (Signed)
 I called Linda Conley again today. She still does not have her new insurance information with her and has no idea what it is. I advised her I will cancel her 09/21/24 appointment with Dr. Helmut tomorrow if we do not hear back from her.

## 2024-09-21 ENCOUNTER — Ambulatory Visit: Payer: MEDICAID | Attending: Surgery | Primary: Family Medicine

## 2024-09-27 ENCOUNTER — Encounter: Payer: MEDICAID | Primary: Family Medicine

## 2024-10-02 NOTE — Telephone Encounter (Signed)
 LVM  & portal msg sent to pt to advise new appt date/time with Dr. Kapasi.    NEW appt date/ time: Tuesday, 2/10 at 11:30 AM    Advised pt to call me back if this new appt does not work and we will accommodate to a different date.

## 2024-10-10 ENCOUNTER — Encounter: Payer: MEDICAID | Attending: Physical Medicine & Rehabilitation | Primary: Family Medicine

## 2024-10-17 ENCOUNTER — Encounter: Payer: MEDICAID | Attending: Physical Medicine & Rehabilitation | Primary: Family Medicine
